# Patient Record
Sex: Male | Born: 1950 | Race: White | Hispanic: No | Marital: Married | Smoking: Never smoker
Health system: Southern US, Community
[De-identification: ages and names within clinical notes are randomized; demographics above are authoritative.]

## PROBLEM LIST (undated history)

## (undated) DIAGNOSIS — G20A1 Parkinson's disease without dyskinesia, without mention of fluctuations: Secondary | ICD-10-CM

## (undated) DIAGNOSIS — K219 Gastro-esophageal reflux disease without esophagitis: Secondary | ICD-10-CM

## (undated) DIAGNOSIS — R05 Cough: Secondary | ICD-10-CM

## (undated) DIAGNOSIS — N200 Calculus of kidney: Secondary | ICD-10-CM

## (undated) DIAGNOSIS — R059 Cough, unspecified: Secondary | ICD-10-CM

## (undated) DIAGNOSIS — K2270 Barrett's esophagus without dysplasia: Secondary | ICD-10-CM

## (undated) HISTORY — DX: Cough, unspecified: R05.9

## (undated) HISTORY — DX: Cough: R05

## (undated) HISTORY — DX: Barrett's esophagus without dysplasia: K22.70

## (undated) HISTORY — DX: Gastro-esophageal reflux disease without esophagitis: K21.9

## (undated) HISTORY — PX: NO PAST SURGERIES: SHX2092

---

## 1998-11-30 DIAGNOSIS — N486 Induration penis plastica: Secondary | ICD-10-CM | POA: Insufficient documentation

## 2004-10-21 ENCOUNTER — Ambulatory Visit: Payer: Self-pay | Admitting: Urology

## 2004-10-21 ENCOUNTER — Emergency Department: Payer: Self-pay | Admitting: Internal Medicine

## 2004-10-23 ENCOUNTER — Emergency Department: Payer: Self-pay | Admitting: Emergency Medicine

## 2004-10-25 ENCOUNTER — Inpatient Hospital Stay: Payer: Self-pay | Admitting: Urology

## 2005-01-21 ENCOUNTER — Ambulatory Visit: Payer: Self-pay | Admitting: Unknown Physician Specialty

## 2005-11-15 ENCOUNTER — Emergency Department: Payer: Self-pay | Admitting: Emergency Medicine

## 2006-12-14 ENCOUNTER — Ambulatory Visit: Payer: Self-pay | Admitting: Pulmonary Disease

## 2008-09-01 ENCOUNTER — Emergency Department: Payer: Self-pay | Admitting: Emergency Medicine

## 2009-03-01 DIAGNOSIS — E559 Vitamin D deficiency, unspecified: Secondary | ICD-10-CM | POA: Insufficient documentation

## 2011-02-05 ENCOUNTER — Ambulatory Visit (INDEPENDENT_AMBULATORY_CARE_PROVIDER_SITE_OTHER)
Admission: RE | Admit: 2011-02-05 | Discharge: 2011-02-05 | Disposition: A | Discharge status: Home / Self Care | Payer: BC Managed Care – PPO | Source: Ambulatory Visit | Attending: Emergency Medicine | Admitting: Emergency Medicine

## 2011-02-05 ENCOUNTER — Other Ambulatory Visit: Payer: Self-pay | Admitting: Emergency Medicine

## 2011-02-05 ENCOUNTER — Institutional Professional Consult (permissible substitution) (INDEPENDENT_AMBULATORY_CARE_PROVIDER_SITE_OTHER): Payer: BC Managed Care – PPO | Admitting: Emergency Medicine

## 2011-02-05 ENCOUNTER — Encounter: Payer: Self-pay | Admitting: Emergency Medicine

## 2011-02-05 DIAGNOSIS — R059 Cough, unspecified: Secondary | ICD-10-CM

## 2011-02-05 DIAGNOSIS — K219 Gastro-esophageal reflux disease without esophagitis: Secondary | ICD-10-CM | POA: Insufficient documentation

## 2011-02-05 DIAGNOSIS — R05 Cough: Secondary | ICD-10-CM

## 2011-02-05 DIAGNOSIS — K2270 Barrett's esophagus without dysplasia: Secondary | ICD-10-CM | POA: Insufficient documentation

## 2011-02-10 NOTE — Assessment & Plan Note (Signed)
Summary: cough   Visit Type:  Initial Consult Primary Provider/Referring Provider:  Dr. Julieanne Manson  CC:  Self referral...assess cough.  Pt says his cough is worse after a meal...sputum is white/tan...sometimes stringy or looks like coffee grounds.  History of Present Illness: 60 yo man, never smoker, hx Barrett's esophagus and GERD. Most recent EGD showed Barretts was stable. Tells me me that he has been cough-free until about 2 months ago after a URI. The cold went away but the cough persisted. Some throat clearing. Globus sensation with a lot of talking. Usually non-productive, but occasionally brings up some white stringy plugs, particles - ? whether this is mucous, ? regurgitated particles. Has had a swallowing study before, no aspiration. Currently on prilosec once daily.   Preventive Screening-Counseling & Management  Alcohol-Tobacco     Alcohol drinks/day: <1     Alcohol type: beer     Smoking Status: never  Current Medications (verified): 1)  Prilosec Otc 20 Mg Tbec (Omeprazole Magnesium) .Marland Kitchen.. 1 By Mouth Daily 2)  Aspirin 81 Mg Tabs (Aspirin) .Marland Kitchen.. 1 By Mouth Daily  Allergies (verified): 1)  ! Toradol 2)  ! * Cefzil 3)  ! Morphine  Past History:  Family History: Last updated: 02/05/2011 Family History Lung Cancer---mother  Risk Factors: Alcohol Use: <1 (02/05/2011)  Past Medical History: Current Problems:  COUGH (ICD-786.2) BARRETTS ESOPHAGUS (ICD-530.85) G E R D (ICD-530.81)  Past Surgical History: none  Family History: Reviewed history and no changes required. Family History Lung Cancer---mother  Social History: Reviewed history and no changes required. Patient never smoked.  Works in [employer], wool, Estate agent, etc... marriedSmoking Status:  never Alcohol drinks/day:  <1  Review of Systems       The patient complains of productive cough and acid heartburn.  The patient denies shortness of breath with activity, shortness of breath at rest,  non-productive cough, coughing up blood, chest pain, irregular heartbeats, indigestion, loss of appetite, weight change, abdominal pain, difficulty swallowing, sore throat, tooth/dental problems, headaches, nasal congestion/difficulty breathing through nose, sneezing, itching, ear ache, anxiety, depression, hand/feet swelling, joint stiffness or pain, rash, change in color of mucus, and fever.    Vital Signs:  Patient profile:   60 year old male Height:      74 inches (187.96 cm) Weight:      217.50 pounds (98.86 kg) BMI:     28.03 O2 Sat:      97 % on Room air Temp:     98.8 degrees F (37.11 degrees C) oral Pulse rate:   81 / minute BP sitting:   104 / 70  (left arm) Cuff size:   regular  Vitals Entered By: Michel Bickers CMA (February 05, 2011 4:21 PM)  O2 Sat at Rest %:  97 O2 Flow:  Room air CC: Self referral...assess cough.  Pt says his cough is worse after a meal...sputum is white/tan...sometimes stringy or looks like coffee grounds Is Patient Diabetic? No Comments Medications reviewed with patient Michel Bickers CMA  February 05, 2011 4:21 PM   Physical Exam  General:  well developed, well nourished, in no acute distress Head:  normocephalic and atraumatic Eyes:  conjunctiva and sclera clear Nose:  no deformity, discharge, inflammation, or lesions Mouth:  no deformity or lesions Neck:  no masses, thyromegaly, or abnormal cervical nodes Chest Wall:  no deformities noted Lungs:  clear bilaterally to auscultation and percussion Heart:  regular rate and rhythm, S1, S2 without murmurs, rubs, gallops, or clicks Msk:  no deformity or scoliosis noted with normal posture Extremities:  no clubbing, cyanosis, edema, or deformity noted Skin:  intact without lesions or rashes Psych:  alert and cooperative; normal mood and affect; normal attention span and concentration   Impression & Recommendations:  Problem # 1:  COUGH (ICD-786.2) Likely started as URI but being sustained and driven by  GERD. - empirically increase GERD therapy - If continues to cough after a month then consider further imaging to r/o bronchiectasis, spiro, possibly FOB  Medications Added to Medication List This Visit: 1)  Prilosec Otc 20 Mg Tbec (Omeprazole magnesium) .Marland Kitchen.. 1 by mouth daily 2)  Aspirin 81 Mg Tabs (Aspirin) .Marland Kitchen.. 1 by mouth daily  Other Orders: T-2 View CXR (71020TC) Consultation Level IV (16109)  Patient Instructions: 1)  CXR today  2)  Increase your prilosec to two times a day for the next 3 weeks, then go back to once daily  3)  Avoid throat clearing 4)  Rest your voice this weekend 5)  Follow up with Dr Delton Coombes in 1 month or as needed

## 2011-02-17 NOTE — Progress Notes (Signed)
Summary: Consult form/[facility]  Consult form/[facility]   Imported By: Sherian Rein 02/11/2011 14:48:18  _____________________________________________________________________  External Attachment:    Type:   Image     Comment:   External Document

## 2011-03-05 ENCOUNTER — Encounter: Payer: Self-pay | Admitting: Emergency Medicine

## 2011-03-10 ENCOUNTER — Ambulatory Visit: Payer: BC Managed Care – PPO | Admitting: Emergency Medicine

## 2015-05-24 ENCOUNTER — Ambulatory Visit (INDEPENDENT_AMBULATORY_CARE_PROVIDER_SITE_OTHER): Payer: Medicare Other | Admitting: Family Medicine

## 2015-05-24 ENCOUNTER — Encounter: Payer: Self-pay | Admitting: Family Medicine

## 2015-05-24 VITALS — BP 120/82 | HR 76 | Temp 98.5°F | Resp 16 | Ht 74.0 in | Wt 218.0 lb

## 2015-05-24 DIAGNOSIS — T148 Other injury of unspecified body region: Secondary | ICD-10-CM

## 2015-05-24 DIAGNOSIS — W57XXXA Bitten or stung by nonvenomous insect and other nonvenomous arthropods, initial encounter: Secondary | ICD-10-CM

## 2015-05-24 MED ORDER — MINOCYCLINE HCL 100 MG PO CAPS: 100.0000 mg | ORAL_CAPSULE | Freq: Two times a day (BID) | ORAL | Status: DC

## 2015-05-24 NOTE — Patient Instructions (Signed)
Use hydrocortisone cream for itching 2-3 x day.

## 2015-05-24 NOTE — Progress Notes (Signed)
Subjective:     Patient ID: Robert Fuller, male   DOB: Dec 11, 1950, 64 y.o.   MRN: [id number]  HPI  Chief Complaint  Patient presents with  . Insect Bite    Tick on chest 2-3 days ago. Pt notes there is a bullseye around the bite. Pt denies fever, N/V. Pt states he had a slight headache.  Reports he got the tick off at 24 hours. Headache has resolved.   Review of Systems  Constitutional: Negative for fever and chills.  Musculoskeletal:       No body aches       Objective:   Physical Exam  Constitutional: He appears well-developed and well-nourished. No distress.  Skin:  Insect bite without residual  Foreign body and surrounding approximately 6 cm.area of erythema       Assessment:    1. Tick bite - minocycline (MINOCIN,DYNACIN) 100 MG capsule; Take 1 capsule (100 mg total) by mouth 2 (two) times daily.  Dispense: 20 capsule; Refill: 1    Plan:   If redness does not resolve with hydrocortisone or increases in size to start abx.

## 2015-07-02 DIAGNOSIS — N200 Calculus of kidney: Secondary | ICD-10-CM | POA: Diagnosis not present

## 2015-09-24 DIAGNOSIS — M79671 Pain in right foot: Secondary | ICD-10-CM | POA: Diagnosis not present

## 2015-09-24 DIAGNOSIS — M2041 Other hammer toe(s) (acquired), right foot: Secondary | ICD-10-CM | POA: Diagnosis not present

## 2015-09-24 DIAGNOSIS — M659 Synovitis and tenosynovitis, unspecified: Secondary | ICD-10-CM | POA: Diagnosis not present

## 2015-12-03 DIAGNOSIS — L309 Dermatitis, unspecified: Secondary | ICD-10-CM | POA: Diagnosis not present

## 2015-12-03 DIAGNOSIS — L02222 Furuncle of back [any part, except buttock]: Secondary | ICD-10-CM | POA: Diagnosis not present

## 2016-02-17 DIAGNOSIS — H903 Sensorineural hearing loss, bilateral: Secondary | ICD-10-CM | POA: Diagnosis not present

## 2016-02-17 DIAGNOSIS — H9313 Tinnitus, bilateral: Secondary | ICD-10-CM | POA: Diagnosis not present

## 2016-02-21 DIAGNOSIS — K219 Gastro-esophageal reflux disease without esophagitis: Secondary | ICD-10-CM | POA: Diagnosis not present

## 2016-02-21 DIAGNOSIS — K2270 Barrett's esophagus without dysplasia: Secondary | ICD-10-CM | POA: Diagnosis not present

## 2016-04-03 ENCOUNTER — Encounter: Payer: Self-pay | Admitting: *Deleted

## 2016-04-06 ENCOUNTER — Ambulatory Visit: Payer: Medicare Other | Admitting: Anesthesiology

## 2016-04-06 ENCOUNTER — Encounter: Payer: Self-pay | Admitting: *Deleted

## 2016-04-06 ENCOUNTER — Encounter
Admission: RE | Disposition: A | Discharge status: Home / Self Care | Payer: Self-pay | Source: Ambulatory Visit | Attending: Gastroenterology

## 2016-04-06 ENCOUNTER — Ambulatory Visit
Admission: RE | Admit: 2016-04-06 | Discharge: 2016-04-06 | Disposition: A | Discharge status: Home / Self Care | Payer: Medicare Other | Source: Ambulatory Visit | Attending: Gastroenterology | Admitting: Gastroenterology

## 2016-04-06 DIAGNOSIS — K219 Gastro-esophageal reflux disease without esophagitis: Secondary | ICD-10-CM | POA: Diagnosis not present

## 2016-04-06 DIAGNOSIS — Z79899 Other long term (current) drug therapy: Secondary | ICD-10-CM | POA: Diagnosis not present

## 2016-04-06 DIAGNOSIS — K2270 Barrett's esophagus without dysplasia: Secondary | ICD-10-CM | POA: Insufficient documentation

## 2016-04-06 DIAGNOSIS — Z87442 Personal history of urinary calculi: Secondary | ICD-10-CM | POA: Insufficient documentation

## 2016-04-06 DIAGNOSIS — Z7982 Long term (current) use of aspirin: Secondary | ICD-10-CM | POA: Insufficient documentation

## 2016-04-06 HISTORY — PX: ESOPHAGOGASTRODUODENOSCOPY (EGD) WITH PROPOFOL: SHX5813

## 2016-04-06 HISTORY — PX: UPPER GI ENDOSCOPY: SHX6162

## 2016-04-06 HISTORY — DX: Calculus of kidney: N20.0

## 2016-04-06 LAB — SURGICAL PATHOLOGY

## 2016-04-06 SURGERY — ESOPHAGOGASTRODUODENOSCOPY (EGD) WITH PROPOFOL
Anesthesia: General

## 2016-04-06 MED ORDER — PROPOFOL 500 MG/50ML IV EMUL
INTRAVENOUS | Status: DC | PRN
  Administered 2016-04-06: 160 ug/kg/min via INTRAVENOUS

## 2016-04-06 MED ORDER — SODIUM CHLORIDE 0.9 % IV SOLN
INTRAVENOUS | Status: DC
  Administered 2016-04-06: 09:00:00 via INTRAVENOUS

## 2016-04-06 MED ORDER — PROPOFOL 10 MG/ML IV BOLUS
INTRAVENOUS | Status: DC | PRN
  Administered 2016-04-06: 40 mg via INTRAVENOUS
  Administered 2016-04-06: 60 mg via INTRAVENOUS

## 2016-04-06 MED ORDER — GLYCOPYRROLATE 0.2 MG/ML IJ SOLN
INTRAMUSCULAR | Status: DC | PRN
  Administered 2016-04-06: 0.2 mg via INTRAVENOUS

## 2016-04-06 MED ORDER — ATROPINE SULFATE 1 MG/10ML IJ SOSY
PREFILLED_SYRINGE | INTRAMUSCULAR | Status: AC
  Filled 2016-04-06: qty 10

## 2016-04-06 MED ORDER — LIDOCAINE HCL (CARDIAC) 20 MG/ML IV SOLN
INTRAVENOUS | Status: DC | PRN
  Administered 2016-04-06: 100 mg via INTRAVENOUS

## 2016-04-06 NOTE — H&P (Signed)
    Primary Care Physician:  [name], MD Primary Gastroenterologist:  Dr. Candace Cruise  Pre-Procedure History & Physical: HPI:  Robert Fuller is a 65 y.o. male is here for an EGD.   Past Medical History  Diagnosis Date  . Cough   . Barrett's esophagus   . Esophageal reflux   . Kidney stone     Past Surgical History  Procedure Laterality Date  . No past surgeries      Prior to Admission medications   Medication Sig Start Date End Date Taking? Authorizing Provider  aspirin 81 MG tablet Take 81 mg by mouth daily.     Yes Historical Provider, MD  minocycline (MINOCIN,DYNACIN) 100 MG capsule Take 1 capsule (100 mg total) by mouth 2 (two) times daily. 05/24/15   Carmon Ginsberg, PA  omeprazole (PRILOSEC) 20 MG capsule Take 20 mg by mouth daily.      Historical Provider, MD    Allergies as of 03/25/2016 - Review Complete 05/24/2015  Allergen Reaction Noted  . Cefprozil    . Ketorolac tromethamine    . Morphine      Family History  Problem Relation Age of Onset  . Lung cancer Mother   . Parkinsonism Father     Social History   Social History  . Marital Status: Married    Spouse Name: N/A  . Number of Children: N/A  . Years of Education: N/A   Occupational History  . textiles    Social History Main Topics  . Smoking status: Never Smoker   . Smokeless tobacco: Never Used  . Alcohol Use: Yes  . Drug Use: No  . Sexual Activity: Not on file   Other Topics Concern  . Not on file   Social History Narrative    Review of Systems: See HPI, otherwise negative ROS  Physical Exam: BP 137/94 mmHg  Pulse 52  Temp(Src) 97 F (36.1 C) (Tympanic)  Resp 18  Ht 6\' 2"  (1.88 m)  Wt 218 lb (98.884 kg)  BMI 27.98 kg/m2  SpO2 97% General:   Alert,  pleasant and cooperative in NAD Head:  Normocephalic and atraumatic. Neck:  Supple; no masses or thyromegaly. Lungs:  Clear throughout to auscultation.    Heart:  Regular rate and rhythm. Abdomen:  Soft, nontender and  nondistended. Normal bowel sounds, without guarding, and without rebound.   Neurologic:  Alert and  oriented x4;  grossly normal neurologically.  Impression/Plan: Robert Fuller is here for an EGD to be performed for Hx of Barrett's  Risks, benefits, limitations, and alternatives regarding  EGD have been reviewed with the patient.  Questions have been answered.  All parties agreeable.   Marissah Vandemark, Lupita Dawn, MD  04/06/2016, 8:29 AM

## 2016-04-06 NOTE — Anesthesia Postprocedure Evaluation (Signed)
Anesthesia Post Note  Patient: LONZA METHOT  Procedure(s) Performed: Procedure(s) (LRB): ESOPHAGOGASTRODUODENOSCOPY (EGD) WITH PROPOFOL (N/A)  Patient location during evaluation: Endoscopy Anesthesia Type: General Level of consciousness: awake and alert Pain management: pain level controlled Vital Signs Assessment: post-procedure vital signs reviewed and stable Respiratory status: spontaneous breathing, nonlabored ventilation, respiratory function stable and patient connected to nasal cannula oxygen Cardiovascular status: blood pressure returned to baseline and stable Postop Assessment: no signs of nausea or vomiting Anesthetic complications: no    Last Vitals:  Filed Vitals:   04/06/16 0944 04/06/16 0954  BP: 134/97 127/97  Pulse: 69 62  Temp:    Resp: 18 12    Last Pain:  Filed Vitals:   04/06/16 0958  PainSc: Asleep                 Precious Haws Mandy Fitzwater

## 2016-04-06 NOTE — Op Note (Signed)
[facility] Gastroenterology Patient Name: Robert Fuller Procedure Date: 04/06/2016 9:09 AM MRN: [id number] Account #: [id number] Date of Birth: [birth year] Admit Type: Outpatient Age: 65 Room: [facility] ENDO ROOM 4 Gender: Male Note Status: Finalized Procedure:            Upper GI endoscopy Indications:          Follow-up of Barrett's esophagus Providers:            Lupita Dawn. Candace Cruise, MD Referring MD:         Laney Pastor (Referring MD) Medicines:            Monitored Anesthesia Care Complications:        No immediate complications. Procedure:            Pre-Anesthesia Assessment:                       - Prior to the procedure, a History and Physical was                        performed, and patient medications, allergies and                        sensitivities were reviewed. The patient's tolerance of                        previous anesthesia was reviewed.                       - The risks and benefits of the procedure and the                        sedation options and risks were discussed with the                        patient. All questions were answered and informed                        consent was obtained.                       - After reviewing the risks and benefits, the patient                        was deemed in satisfactory condition to undergo the                        procedure.                       After obtaining informed consent, the endoscope was                        passed under direct vision. Throughout the procedure,                        the patient's blood pressure, pulse, and oxygen                        saturations were monitored continuously. The Endoscope  was introduced through the mouth, and advanced to the                        second part of duodenum. The upper GI endoscopy was                        accomplished without difficulty. The patient tolerated                        the procedure well. Findings:     The esophagus and gastroesophageal junction were examined with white       light and narrow band imaging (NBI). There were esophageal mucosal       changes consistent with short-segment Barrett's esophagus. These changes       involved the mucosa at the upper extent of the gastric folds (40 cm from       the incisors) extending to the Z-line (37 cm from the incisors).       Circumferential salmon-colored mucosa was present from 37 to 39 cm, and       scattered islands of salmon-colored mucosa were present from 39 to 40       cm. The maximum longitudinal extent of these esophageal mucosal changes       was 3 cm in length. Mucosa was biopsied with a cold forceps for       histology in 4 quadrants in the lower third of the esophagus and at 37       and 40 cm from the incisors. A total of 2 specimen bottles were sent to       pathology.      The exam was otherwise without abnormality.      The entire examined stomach was normal.      The examined duodenum was normal. Impression:           - Esophageal mucosal changes consistent with                        short-segment Barrett's esophagus. Biopsied.                       - The examination was otherwise normal.                       - Normal stomach.                       - Normal examined duodenum. Recommendation:       - Discharge patient to home.                       - Observe patient's clinical course.                       - The findings and recommendations were discussed with                        the patient. Procedure Code(s):    --- Professional ---                       [phone], Esophagogastroduodenoscopy, flexible, transoral;  with biopsy, single or multiple Diagnosis Code(s):    --- Professional ---                       K22.70, Barrett's esophagus without dysplasia CPT copyright 2016 American Medical Association. All rights reserved. The codes documented in this report are preliminary and upon coder  review may  be revised to meet current compliance requirements. Hulen Luster, MD 04/06/2016 9:29:02 AM This report has been signed electronically. Number of Addenda: 0 Note Initiated On: 04/06/2016 9:09 AM      [facility]

## 2016-04-06 NOTE — Anesthesia Preprocedure Evaluation (Signed)
Anesthesia Evaluation  Patient identified by MRN, date of birth, ID band Patient awake    Reviewed: Allergy & Precautions, H&P , NPO status , Patient's Chart, lab work & pertinent test results  History of Anesthesia Complications Negative for: history of anesthetic complications  Airway Mallampati: III  TM Distance: >3 FB Neck ROM: full    Dental  (+) Poor Dentition   Pulmonary neg pulmonary ROS, neg shortness of breath,    Pulmonary exam normal breath sounds clear to auscultation       Cardiovascular Exercise Tolerance: Good (-) angina(-) Past MI and (-) DOE negative cardio ROS Normal cardiovascular exam Rhythm:regular Rate:Normal     Neuro/Psych negative neurological ROS  negative psych ROS   GI/Hepatic Neg liver ROS, GERD  Controlled,  Endo/Other  negative endocrine ROS  Renal/GU Renal disease  negative genitourinary   Musculoskeletal   Abdominal   Peds  Hematology negative hematology ROS (+)   Anesthesia Other Findings Past Medical History:   Cough                                                        Barrett's esophagus                                          Esophageal reflux                                            Kidney stone                                                Past Surgical History:   NO PAST SURGERIES                                            BMI    Body Mass Index   27.97 kg/m 2    Signs and symptoms suggestive of sleep apnea    Reproductive/Obstetrics negative OB ROS                             Anesthesia Physical Anesthesia Plan  ASA: III  Anesthesia Plan: General   Post-op Pain Management:    Induction:   Airway Management Planned:   Additional Equipment:   Intra-op Plan:   Post-operative Plan:   Informed Consent: I have reviewed the patients History and Physical, chart, labs and discussed the procedure including the risks, benefits  and alternatives for the proposed anesthesia with the patient or authorized representative who has indicated his/her understanding and acceptance.   Dental Advisory Given  Plan Discussed with: Anesthesiologist, CRNA and Surgeon  Anesthesia Plan Comments:         Anesthesia Quick Evaluation

## 2016-04-06 NOTE — Transfer of Care (Signed)
Immediate Anesthesia Transfer of Care Note  Patient: Robert Fuller  Procedure(s) Performed: Procedure(s): ESOPHAGOGASTRODUODENOSCOPY (EGD) WITH PROPOFOL (N/A)  Patient Location: Endoscopy Unit  Anesthesia Type:General  Level of Consciousness: sedated  Airway & Oxygen Therapy: Patient connected to nasal cannula oxygen  Post-op Assessment: Post -op Vital signs reviewed and stable  Post vital signs: stable  Last Vitals:  Filed Vitals:   04/06/16 0924 04/06/16 0925  BP: 107/70 107/70  Pulse: 65 67  Temp: 36.2 C 36.2 C  Resp: 11 10    Last Pain:  Filed Vitals:   04/06/16 0925  PainSc: Asleep         Complications: No apparent anesthesia complications

## 2016-04-07 ENCOUNTER — Encounter: Payer: Self-pay | Admitting: Gastroenterology

## 2016-04-30 ENCOUNTER — Emergency Department
Admission: EM | Admit: 2016-04-30 | Discharge: 2016-04-30 | Disposition: A | Discharge status: Home / Self Care | Payer: Medicare Other | Attending: Emergency Medicine | Admitting: Emergency Medicine

## 2016-04-30 ENCOUNTER — Encounter: Payer: Self-pay | Admitting: *Deleted

## 2016-04-30 DIAGNOSIS — Z711 Person with feared health complaint in whom no diagnosis is made: Secondary | ICD-10-CM | POA: Insufficient documentation

## 2016-04-30 DIAGNOSIS — Z7982 Long term (current) use of aspirin: Secondary | ICD-10-CM | POA: Diagnosis not present

## 2016-04-30 DIAGNOSIS — Z79899 Other long term (current) drug therapy: Secondary | ICD-10-CM | POA: Diagnosis not present

## 2016-04-30 DIAGNOSIS — Z0189 Encounter for other specified special examinations: Secondary | ICD-10-CM | POA: Diagnosis present

## 2016-04-30 NOTE — ED Notes (Signed)
Pt put foot in boot and stepped on a dead bat

## 2016-04-30 NOTE — ED Notes (Signed)
Pt to ed with c/o finding dead bat in shoe this am.  Pt reports he stuck foot into the shoe, felt something and then reached into shoe and removed dead bat with his hand.  Pt states immediately used hydrogen peroxide on hand and on foot, even though foot was originally covered in sock.

## 2016-04-30 NOTE — ED Provider Notes (Signed)
[facility] Emergency Department Provider Note  ____________________________________________  Time seen: Approximately 12:37 PM  I have reviewed the triage vital signs and the nursing notes.   HISTORY  Chief Complaint bat exposure     HPI Robert Fuller is a 65 y.o. male, NAD, who presents to the emergency department after being exposed to a dead bat a few hours ago. He had just returned from a 10-day family vacation and went to put on a pair of boots that he stores in his garage. He felt what he thought was a sock in the toe of the boot, but discovered a dead bat when he overturned the boot in his hand. He was wearing a pair of heavy-duty work socks when his foot came in contact with the dead bat and washed his hands and foot thoroughly with soap, water and hydrogen peroxide. He called his primary care provider (Dr. Rosanna Randy) and was told to report to the ED. He brought the dead bat along with him in a paper towel inside of a ziploc bag. He has no open wounds and no puncture wounds or broken skin resulted from contact with the dead bat. He denies pain, fever, chills, skin changes, nausea, vomiting, or fatigue. He reports that wants the dead bat tested for rabies.   Past Medical History  Diagnosis Date  . Cough   . Barrett's esophagus   . Esophageal reflux   . Kidney stone     Patient Active Problem List   Diagnosis Date Noted  . G E R D 02/05/2011  . BARRETTS ESOPHAGUS 02/05/2011  . COUGH 02/05/2011    Past Surgical History  Procedure Laterality Date  . No past surgeries    . Esophagogastroduodenoscopy (egd) with propofol N/A 04/06/2016    Procedure: ESOPHAGOGASTRODUODENOSCOPY (EGD) WITH PROPOFOL;  Surgeon: Hulen Luster, MD;  Location: [facility] ENDOSCOPY;  Service: Gastroenterology;  Laterality: N/A;  . Upper gi endoscopy  04/06/16    normal other than esophageal changes present consistent with barretts esophagus, repeat 2019    Current Outpatient Rx  Name   Route  Sig  Dispense  Refill  . aspirin 81 MG tablet   Oral   Take 81 mg by mouth daily.           . minocycline (MINOCIN,DYNACIN) 100 MG capsule   Oral   Take 1 capsule (100 mg total) by mouth 2 (two) times daily.   20 capsule   1   . omeprazole (PRILOSEC) 20 MG capsule   Oral   Take 20 mg by mouth daily.             Allergies Cefprozil; Ketorolac tromethamine; and Morphine  Family History  Problem Relation Age of Onset  . Lung cancer Mother   . Parkinsonism Father     Social History Social History  Substance Use Topics  . Smoking status: Never Smoker   . Smokeless tobacco: Never Used  . Alcohol Use: Yes     Review of Systems  Constitutional: No fever/chills Cardiovascular: No chest pain. Respiratory: No shortness of breath. No wheezing.  Gastrointestinal: No abdominal pain.  No nausea, vomiting.  Musculoskeletal: Negative for foot or hand pain.  Skin: Negative for rash, open wounds, bleeding, lesions, swelling. Neurological: Negative for headaches, focal weakness or numbness. No tingling 10-point ROS otherwise negative.  ____________________________________________   PHYSICAL EXAM:  VITAL SIGNS: ED Triage Vitals  Enc Vitals Group     BP 04/30/16 1151 146/81 mmHg  Pulse Rate 04/30/16 1151 74     Resp 04/30/16 1151 20     Temp 04/30/16 1151 98.6 F (37 C)     Temp Source 04/30/16 1151 Oral     SpO2 04/30/16 1151 97 %     Weight 04/30/16 1151 220 lb (99.791 kg)     Height 04/30/16 1151 6\' 2"  (1.88 m)     Head Cir --      Peak Flow --      Pain Score --      Pain Loc --      Pain Edu? --      Excl. in [place]? --      Constitutional: Alert and oriented. Well appearing and in no acute distress. Eyes: Conjunctivae are normal.  Head: Atraumatic. Respiratory: Normal respiratory effort without tachypnea or retractions.  Neurologic:  Normal speech and language. No gross focal neurologic deficits are appreciated. Gait and posture are normal Skin:   Skin is warm, dry and intact. No rash noted. Psychiatric: Mood and affect are normal. Speech and behavior are normal. Patient exhibits appropriate insight and judgement.   ____________________________________________   LABS  None ____________________________________________  EKG  None ____________________________________________  RADIOLOGY  None ____________________________________________    PROCEDURES  Procedure(s) performed: None   Medications - No data to display   ____________________________________________   INITIAL IMPRESSION / ASSESSMENT AND PLAN / ED COURSE  Patient's diagnosis is consistent with feared complaint without diagnosis. Patient was reassured that he was unlikely exposed to rabies as the transmittal is through saliva and open skin. Patient was given information in regards to rabies for educational purposes. He was also given the phone number for [place] control so that he can call and gain further information. Patient is given ED precautions to return to the ED for any worsening or new symptoms.    ____________________________________________  FINAL CLINICAL IMPRESSION(S) / ED DIAGNOSES  Final diagnoses:  Feared complaint without diagnosis      NEW MEDICATIONS STARTED DURING THIS VISIT:  Discharge Medication List as of 04/30/2016 12:39 PM           Braxton Feathers, PA-C 04/30/16 1311  Lavonia Drafts, MD 04/30/16 1526

## 2016-04-30 NOTE — Discharge Instructions (Signed)
Rabies °Rabies is a viral infection that can be spread to people from infected animals. The infection affects the brain and central nervous system. Once the disease develops, it almost always causes death. Because of this, when a person is bitten by an animal that may have rabies, treatment to prevent rabies often needs to be started whether or not the animal is known to be infected. Prompt treatment with the rabies vaccine and rabies immune globulin is very effective at preventing the infection from developing in people who have been exposed to the rabies virus. °CAUSES  °Rabies is caused by a virus that lives inside some animals. When a person is bitten by an infected animal, the rabies virus is spread to the person through the infected spit (saliva) of the animal. This virus can be carried by animals such as dogs, cats, skunks, bats, woodchucks, raccoons, coyotes, and foxes. °SYMPTOMS  °By the time symptoms appear, rabies is usually fatal for the person. Common symptoms include: °· Headache. °· Fever. °· Fatigue and weakness. °· Agitation. °· Anxiety. °· Confusion. °· Unusual behavior, such as hyperactivity, fear of water (hydrophobia), or fear of air (aerophobia). °· Hallucinations. °· Insomnia. °· Weakness in the arms or legs. °· Difficulty swallowing. °Most people get sick in 1-3 months after being bitten. This often varies and may depend on the location of the bite. The infection will take less time to develop if the bite occurred closer to the head.  °DIAGNOSIS  °To determine if a person is infected, several tests must be performed, such as: °· A skin biopsy. °· A saliva test. °· A lumbar puncture to remove spinal fluid so it can be examined. °· Blood tests. °TREATMENT  °Treatment to prevent the infection from developing (post-exposure prophylaxis, PEP) is often started before knowing for sure if the person has been exposed to the rabies virus. PEP involves cleaning the wound, giving an antibody injection  (rabies immune globulin), and giving a series of rabies vaccine injections. The series of injections are usually given over a two-week period. If possible, the animal that bit the person will be observed to see if it remains healthy. If the animal has been killed, it can be sent to a state laboratory and examined to see if the animal had rabies. °If a person is bitten by a domestic animal (dog, cat, or ferret) that appears healthy and can be observed to see if it remains healthy, often no further treatment is necessary other than care of the wounds caused by the animal. °Rabies is often a fatal illness once the infection develops in a person. Although a few people who developed rabies have survived after experimental treatment with certain drugs, all these survivors still had severe nervous system problems after the treatment. This is why caregivers use extra caution and begin PEP treatment for people who have been bitten by animals that are possibly infected with rabies.  °HOME CARE INSTRUCTIONS  °If you were bitten by an unknown animal, make sure you know your caregiver's instructions for follow-up. If the animal was sent to a laboratory for examination, ask when the test results will be ready. Make sure you get the test results.  °Take these steps to care for your wound: °· Keep the wound clean, dry, and dressed as directed by your caregiver. °· Keep the injured part elevated as much as possible. °· Do not resume use of the affected area until directed. °· Only take over-the-counter or prescription medicines as directed by your   caregiver. °· Keep all follow-up appointments as directed by your caregiver. °PREVENTION  °To prevent rabies, people need to reduce their risk of having contact with infected animals.  °· Make sure your pets (dogs, cats, ferrets) are vaccinated against rabies. Keep these vaccinations up-to-date as directed by your veterinarian. °· Supervise your pets when they are outside. Keep them away  from wild animals. °· Call your local animal control services to report any stray animals. These animals may not be vaccinated. °· Stay away from stray or wild animals. °· Consider getting the rabies vaccine (preexposure) if you are traveling to an area where rabies is common or if your job or activities involve possible contact with wild or stray animals. Discuss this with your caregiver. °  °This information is not intended to replace advice given to you by your health care provider. Make sure you discuss any questions you have with your health care provider. °  °Document Released: 11/16/2005 Document Revised: 12/07/2014 Document Reviewed: 06/14/2012 °Elsevier Interactive Patient Education ©2016 Elsevier Inc. ° °

## 2016-04-30 NOTE — ED Notes (Signed)
Pt given d/c instructions but unable to have pt sign due to computer issues.

## 2016-05-04 ENCOUNTER — Telehealth: Payer: Self-pay | Admitting: Family Medicine

## 2016-05-04 NOTE — Telephone Encounter (Signed)
Patient has been advised the Dr. Rosanna Randy recommended he go with the health department's recommendation.  ED

## 2016-05-04 NOTE — Telephone Encounter (Signed)
Pt would like a call back from Dr. Rosanna Randy. Pt stated that he had found a dead bat in his shoe after he had already put his foot into the shoe and our office advised him to go to the ER. Pt stated he went to the ER and they sent the bat off for testing. Pt stated the test came back inconclusive for rabies and was advised by the hospital the bat was dead dead so the rabies would have been dead as well and no need to worry with the shots. Pt said he received a call from the Health Dept and they advised pt since it was inconclusive he should go ahead and get the series of rabies shots. Pt would like to confirm with Dr. Rosanna Randy what he thinks pt's should do. Pt stated that since this has to do with a series of several shots he would like to speak directly with Dr. Rosanna Randy. Thanks TNP

## 2016-05-05 ENCOUNTER — Encounter: Payer: Self-pay | Admitting: Emergency Medicine

## 2016-05-05 ENCOUNTER — Emergency Department
Admission: EM | Admit: 2016-05-05 | Discharge: 2016-05-05 | Disposition: A | Discharge status: Home / Self Care | Payer: Medicare Other | Attending: Emergency Medicine | Admitting: Emergency Medicine

## 2016-05-05 DIAGNOSIS — Z79899 Other long term (current) drug therapy: Secondary | ICD-10-CM | POA: Diagnosis not present

## 2016-05-05 DIAGNOSIS — Z7982 Long term (current) use of aspirin: Secondary | ICD-10-CM | POA: Diagnosis not present

## 2016-05-05 DIAGNOSIS — Z23 Encounter for immunization: Secondary | ICD-10-CM

## 2016-05-05 DIAGNOSIS — Z203 Contact with and (suspected) exposure to rabies: Secondary | ICD-10-CM | POA: Insufficient documentation

## 2016-05-05 MED ORDER — RABIES VACCINE, PCEC IM SUSR
1.0000 mL | Freq: Once | INTRAMUSCULAR | Status: AC
  Administered 2016-05-05: 1 mL via INTRAMUSCULAR
  Filled 2016-05-05: qty 1

## 2016-05-05 MED ORDER — RABIES IMMUNE GLOBULIN 150 UNIT/ML IM INJ
20.0000 [IU]/kg | INJECTION | Freq: Once | INTRAMUSCULAR | Status: AC
  Administered 2016-05-05: 2025 [IU] via INTRAMUSCULAR
  Filled 2016-05-05: qty 14

## 2016-05-05 NOTE — ED Provider Notes (Signed)
[facility] Emergency Department Provider Note  ____________________________________________  Time seen: Approximately 2:38 PM  I have reviewed the triage vital signs and the nursing notes.   HISTORY  Chief Complaint Rabies Injection    HPI Robert Fuller is a 65 y.o. male , NAD, presents to the emergency department to begin rabies post exposure prophylactic immunization. Patient was seen in this emergency department approximately 5 days ago for exposure to a dead bat. The bat was sent for testing that was inconclusive for rabies. Since the testing was inconclusive the patient was sent to this emergency department to begin rabies immunization. Patient denies any fevers, chills, body aches, abdominal pain, nausea, vomiting, skin sores, open wounds. Has had no further exposures to other potentially rabid animals since his last visit.   Past Medical History  Diagnosis Date  . Cough   . Barrett's esophagus   . Esophageal reflux   . Kidney stone     Patient Active Problem List   Diagnosis Date Noted  . G E R D 02/05/2011  . BARRETTS ESOPHAGUS 02/05/2011  . COUGH 02/05/2011    Past Surgical History  Procedure Laterality Date  . No past surgeries    . Esophagogastroduodenoscopy (egd) with propofol N/A 04/06/2016    Procedure: ESOPHAGOGASTRODUODENOSCOPY (EGD) WITH PROPOFOL;  Surgeon: Hulen Luster, MD;  Location: [facility] ENDOSCOPY;  Service: Gastroenterology;  Laterality: N/A;  . Upper gi endoscopy  04/06/16    normal other than esophageal changes present consistent with barretts esophagus, repeat 2019    Current Outpatient Rx  Name  Route  Sig  Dispense  Refill  . aspirin 81 MG tablet   Oral   Take 81 mg by mouth daily.           . minocycline (MINOCIN,DYNACIN) 100 MG capsule   Oral   Take 1 capsule (100 mg total) by mouth 2 (two) times daily.   20 capsule   1   . omeprazole (PRILOSEC) 20 MG capsule   Oral   Take 20 mg by mouth daily.              Allergies Cefprozil; Ketorolac tromethamine; and Morphine  Family History  Problem Relation Age of Onset  . Lung cancer Mother   . Parkinsonism Father     Social History Social History  Substance Use Topics  . Smoking status: Never Smoker   . Smokeless tobacco: Never Used  . Alcohol Use: Yes     Review of Systems  Constitutional: No fever/chills, Fatigue Eyes: No visual changes.  Cardiovascular: No chest pain. Respiratory: No cough. No shortness of breath. No wheezing.  Gastrointestinal: No abdominal pain.  No nausea, vomiting.  No diarrhea.   Musculoskeletal: Negative for general myalgias.  Skin: Negative for rash, redness, swelling, open wounds, skin sores, oozing, weeping. Neurological: Negative for headaches, focal weakness or numbness. No tingling 10-point ROS otherwise negative.  ____________________________________________   PHYSICAL EXAM:  VITAL SIGNS: ED Triage Vitals  Enc Vitals Group     BP 05/05/16 1415 145/88 mmHg     Pulse Rate 05/05/16 1415 76     Resp 05/05/16 1415 18     Temp 05/05/16 1415 98.6 F (37 C)     Temp src --      SpO2 05/05/16 1415 95 %     Weight 05/05/16 1415 220 lb (99.791 kg)     Height 05/05/16 1415 6\' 2"  (1.88 m)     Head Cir --  Peak Flow --      Pain Score 05/05/16 1419 0     Pain Loc --      Pain Edu? --      Excl. in [place]? --      Constitutional: Alert and oriented. Well appearing and in no acute distress. Eyes: Conjunctivae are normal. Head: Atraumatic.  Neck: Supple with FROM.  Hematological/Lymphatic/Immunilogical: No cervical lymphadenopathy. Cardiovascular: Normal rate, regular rhythm. Normal S1 and S2. No murmurs, rubs, gallops. Good peripheral circulation. Respiratory: Normal respiratory effort without tachypnea or retractions. Lungs CTAB with breath sounds in all lung fields. Neurologic:  Normal speech and language. No gross focal neurologic deficits are appreciated. Gait and posture are normal   Skin:  Skin is warm, dry and intact. No rash noted. Psychiatric: Mood and affect are normal. Speech and behavior are normal. Patient exhibits appropriate insight and judgement.   ____________________________________________   LABS  None  ____________________________________________  EKG  None ____________________________________________  RADIOLOGY  None ____________________________________________    PROCEDURES  Procedure(s) performed: None   Medications  rabies vaccine (RABAVERT) injection 1 mL (1 mL Intramuscular Given 05/05/16 1617)  rabies immune globulin (HYPERAB) injection 2,025 Units (2,025 Units Intramuscular Given 05/05/16 1618)     ____________________________________________   INITIAL IMPRESSION / ASSESSMENT AND PLAN / ED COURSE  I spoke with Dr. Loyce Dys, epidemiologist with the [facility] in regards to this patient's case. She states that considering the lab work on the animal was inconclusive in his best area on the side of caution and proceed with prophylaxis for rabies immunization. She also instructs that considering the potential exposure was on the patient's right side of his body, that the immunoglobulin should be administered into the anterior lateral thigh and deltoid on the right side of the body. RabAvert immunization should be given in the left deltoid.  Patient's diagnosis is consistent with need for rabies vaccination. Patient will be discharged home with exit care on rabies and the rabies vaccination. Patient is to follow up with this emergency department in 3 days for continuation of immunization schedule. He will need RabAvert immunization on day 3, 7, 14 to finish the series. Patient notes that he will be out of town on a job when his stay 14 immunization is due. He will need a medical before that immunization to be able to get that while out of town. Patient is given ED precautions to return to the ED  for any worsening or new symptoms.    ____________________________________________  FINAL CLINICAL IMPRESSION(S) / ED DIAGNOSES  Final diagnoses:  Need for rabies vaccination      NEW MEDICATIONS STARTED DURING THIS VISIT:  New Prescriptions   No medications on file         Braxton Feathers, PA-C 05/05/16 1626  Lavonia Drafts, MD 05/05/16 1941

## 2016-05-05 NOTE — ED Notes (Signed)
Pt states over a week ago pt put on his boot after being out of town week and there was a dead bat in his boot. Pt went to ED initially on Wednesday. Today pt is here for rabies vaccine, pt states his family and PCP both recommend rabies vaccine

## 2016-05-05 NOTE — ED Notes (Signed)
Pt instructed on rabies vaccine information and subsequent injections. Informed patient he may go to [facility] for the rabies vaccine also.

## 2016-05-05 NOTE — Discharge Instructions (Signed)
Rabies °Rabies is a viral infection that can be spread to people from infected animals. The infection affects the brain and central nervous system. Once the disease develops, it almost always causes death. Because of this, when a person is bitten by an animal that may have rabies, treatment to prevent rabies often needs to be started whether or not the animal is known to be infected. Prompt treatment with the rabies vaccine and rabies immune globulin is very effective at preventing the infection from developing in people who have been exposed to the rabies virus. °CAUSES  °Rabies is caused by a virus that lives inside some animals. When a person is bitten by an infected animal, the rabies virus is spread to the person through the infected spit (saliva) of the animal. This virus can be carried by animals such as dogs, cats, skunks, bats, woodchucks, raccoons, coyotes, and foxes. °SYMPTOMS  °By the time symptoms appear, rabies is usually fatal for the person. Common symptoms include: °· Headache. °· Fever. °· Fatigue and weakness. °· Agitation. °· Anxiety. °· Confusion. °· Unusual behavior, such as hyperactivity, fear of water (hydrophobia), or fear of air (aerophobia). °· Hallucinations. °· Insomnia. °· Weakness in the arms or legs. °· Difficulty swallowing. °Most people get sick in 1-3 months after being bitten. This often varies and may depend on the location of the bite. The infection will take less time to develop if the bite occurred closer to the head.  °DIAGNOSIS  °To determine if a person is infected, several tests must be performed, such as: °· A skin biopsy. °· A saliva test. °· A lumbar puncture to remove spinal fluid so it can be examined. °· Blood tests. °TREATMENT  °Treatment to prevent the infection from developing (post-exposure prophylaxis, PEP) is often started before knowing for sure if the person has been exposed to the rabies virus. PEP involves cleaning the wound, giving an antibody injection  (rabies immune globulin), and giving a series of rabies vaccine injections. The series of injections are usually given over a two-week period. If possible, the animal that bit the person will be observed to see if it remains healthy. If the animal has been killed, it can be sent to a state laboratory and examined to see if the animal had rabies. °If a person is bitten by a domestic animal (dog, cat, or ferret) that appears healthy and can be observed to see if it remains healthy, often no further treatment is necessary other than care of the wounds caused by the animal. °Rabies is often a fatal illness once the infection develops in a person. Although a few people who developed rabies have survived after experimental treatment with certain drugs, all these survivors still had severe nervous system problems after the treatment. This is why caregivers use extra caution and begin PEP treatment for people who have been bitten by animals that are possibly infected with rabies.  °HOME CARE INSTRUCTIONS  °If you were bitten by an unknown animal, make sure you know your caregiver's instructions for follow-up. If the animal was sent to a laboratory for examination, ask when the test results will be ready. Make sure you get the test results.  °Take these steps to care for your wound: °· Keep the wound clean, dry, and dressed as directed by your caregiver. °· Keep the injured part elevated as much as possible. °· Do not resume use of the affected area until directed. °· Only take over-the-counter or prescription medicines as directed by your   caregiver.  Keep all follow-up appointments as directed by your caregiver. PREVENTION  To prevent rabies, people need to reduce their risk of having contact with infected animals.   Make sure your pets (dogs, cats, ferrets) are vaccinated against rabies. Keep these vaccinations up-to-date as directed by your veterinarian.  Supervise your pets when they are outside. Keep them away  from wild animals.  Call your local animal control services to report any stray animals. These animals may not be vaccinated.  Stay away from stray or wild animals.  Consider getting the rabies vaccine (preexposure) if you are traveling to an area where rabies is common or if your job or activities involve possible contact with wild or stray animals. Discuss this with your caregiver.   This information is not intended to replace advice given to you by your health care provider. Make sure you discuss any questions you have with your health care provider.   Document Released: 11/16/2005 Document Revised: 12/07/2014 Document Reviewed: 06/14/2012 Elsevier Interactive Patient Education 2016 [employer].   Rabies Vaccine: What You Need to Know  WHAT IS RABIES?  Rabies is a serious disease. It is caused by a virus.  Rabies is mainly a disease of animals. Humans get rabies when they are bitten by infected animals.  At first there might not be any symptoms. But weeks, or even years after a bite, rabies can cause pain, fatigue, headaches, fever, and irritability. These are followed by seizures, hallucinations, and paralysis. Human rabies is almost always fatal.  Wild animals, especially bats, are the most common source of human rabies infection in the [place]. Skunks, raccoons, dogs, cats, coyotes, foxes, and other mammals can also transmit the disease.  Human rabies is rare in the [place]. There have been only 4 cases diagnosed since 1990. However, between 16,000 and 39,000 people are vaccinated each year as a precaution after animal bites. Also, rabies is far more common in other parts of the world, with about 40,000 to 70,000 rabies-related deaths worldwide each year. Bites from unvaccinated dogs cause most of these cases. Rabies vaccine can prevent rabies.  RABIES VACCINE  Rabies vaccine is given to people at high risk of rabies to protect them if they are exposed. It can also prevent  the disease if it is given to a person after they have been exposed.  Rabies vaccine is made from killed rabies virus. It cannot cause rabies. WHO SHOULD GET RABIES VACCINE AND WHEN?  Preventive Vaccination (No Exposure)  People at high risk of exposure to rabies, such as veterinarians, Insurance account manager, rabies laboratory workers, spelunkers, and rabies biologics production workers should be offered rabies vaccine.  The vaccine should also be considered for:  People whose activities bring them into frequent contact with rabies virus or with possibly rabid animals.  International travelers who are likely to come in contact with animals in parts of the world where rabies is common. The pre-exposure schedule for rabies vaccination is 3 doses, given at the following times:  Dose 1: As appropriate.  Dose 2: 7 days after Dose 1.  Dose 3: 21 days or 28 days after Dose 1. For laboratory workers and others who may be repeatedly exposed to rabies virus, periodic testing for immunity is recommended and booster doses should be given as needed. (Testing or booster doses are not recommended for travelers). Ask your doctor for details. Vaccination After an Exposure  Anyone who has been bitten by an animal, or who otherwise may have been  exposed to rabies, should clean the wound and see a doctor immediately. The doctor will determine if they need to be vaccinated.  A person who is exposed and has never been vaccinated against rabies should get 4 doses of rabies vaccine: one dose right away and additional doses on the 3rd, 7th, and 14th days. They should also get another shot called Rabies Immune Globulin at the same time as the first dose.  A person who has been previously vaccinated should get 2 doses of rabies vaccine: one right away and another on the 3rd day. Rabies Immune Globulin is not needed.  TELL YOUR DOCTOR IF:  Talk with a doctor before getting rabies vaccine if you:  Ever had a serious  (life-threatening) allergic reaction to a previous dose of rabies vaccine or to any component of the vaccine; tell your doctor if you have any severe allergies.  Have a weakened immune system because of:  HIV, AIDS, or another disease that affects the immune system.  Treatment with drugs that affect the immune system, such as steroids.  Cancer or cancer treatment with radiation or drugs. If you have a minor illness, such as a cold, you can be vaccinated. If you are moderately or severely ill, you should probably wait until you recover before getting a routine (non-exposure) dose of rabies vaccine.  If you have been exposed to rabies virus, you should get the vaccine regardless of any other illnesses you may have.  WHAT ARE THE RISKS FROM RABIES VACCINE?  A vaccine, like any medicine, is capable of causing serious problems, such as severe allergic reactions. The risk of a vaccine causing serious harm, or death, is extremely small. Serious problems from rabies vaccine are very rare.  Mild problems:  Soreness, redness, swelling, or itching where the shot was given (30% to 74%).  Headache, nausea, abdominal pain, muscle aches, or dizziness (5% to 40%). Moderate problems:  Hives, pain in the joints, or fever (about 6% of booster doses).  Other nervous system disorders, such as Guillain-Barr Syndrome (GBS), have been reported after rabies vaccine, but this happens so rarely that it is not known whether they are related to the vaccine. Note: Several brands of rabies vaccine are available in the [place], and reactions may vary between brands. Your provider can give you more information about a particular brand.  WHAT IF THERE IS A SERIOUS REACTION?  What should I look for?  Look for anything that concerns you, such as signs of a severe allergic reaction, very high fever, or behavior changes.  Signs of a severe allergic reaction can include hives, swelling of the face and throat, difficulty  breathing, a fast heartbeat, dizziness, and weakness. These would start a few minutes to a few hours after the vaccination.  What should I do?  If you think it is a severe allergic reaction or other emergency that cannot wait, call 911 or get the person to the nearest hospital. Otherwise, call your doctor.  Afterward, the reaction should be reported to the Vaccine Adverse Event Reporting System (VAERS). Your doctor might file this report, or you can do it yourself through the VAERS website at www.vaers.SamedayNews.es or by calling 3397708233. VAERS is only for reporting reactions. They do not give medical advice.  HOW CAN I LEARN MORE?  Ask your doctor.  Call your local or state health department.  Contact the Centers for Disease Control and Prevention (CDC):  Visit the CDC rabies website at [place].no CDC Rabies Vaccine VIS (  09/04/08)  This information is not intended to replace advice given to you by your health care provider. Make sure you discuss any questions you have with your health care provider.  Document Released: 09/13/2006 Document Revised: 04/02/2015 Document Reviewed: 03/08/2013  Elsevier Interactive Patient Education [employer].

## 2016-05-08 ENCOUNTER — Encounter: Payer: Self-pay | Admitting: *Deleted

## 2016-05-08 ENCOUNTER — Ambulatory Visit
Admission: EM | Admit: 2016-05-08 | Discharge: 2016-05-08 | Disposition: A | Discharge status: Home / Self Care | Payer: Medicare Other | Attending: Family Medicine | Admitting: Family Medicine

## 2016-05-08 MED ORDER — RABIES VACCINE, PCEC IM SUSR
1.0000 mL | Freq: Once | INTRAMUSCULAR | Status: AC
  Administered 2016-05-08: 1 mL via INTRAMUSCULAR

## 2016-05-08 NOTE — ED Notes (Signed)
Here for Rabies vaccine.

## 2016-05-12 ENCOUNTER — Ambulatory Visit
Admission: EM | Admit: 2016-05-12 | Discharge: 2016-05-12 | Disposition: A | Discharge status: Home / Self Care | Payer: Medicare Other | Attending: Internal Medicine | Admitting: Internal Medicine

## 2016-05-12 ENCOUNTER — Encounter: Payer: Self-pay | Admitting: *Deleted

## 2016-05-12 DIAGNOSIS — Z2914 Encounter for prophylactic rabies immune globin: Secondary | ICD-10-CM

## 2016-05-12 DIAGNOSIS — Z203 Contact with and (suspected) exposure to rabies: Secondary | ICD-10-CM

## 2016-05-12 MED ORDER — RABIES VACCINE, PCEC IM SUSR
1.0000 mL | Freq: Once | INTRAMUSCULAR | Status: AC
  Administered 2016-05-12: 1 mL via INTRAMUSCULAR

## 2016-05-12 NOTE — ED Notes (Signed)
Rechecked patient in 15 minutes. No adverse reaction observed.

## 2016-05-12 NOTE — ED Notes (Signed)
Patient is here for 2nd follow up rabies vaccination. Patient does not report any adverse reactions to rabies vaccinations.

## 2016-05-19 DIAGNOSIS — Z203 Contact with and (suspected) exposure to rabies: Secondary | ICD-10-CM | POA: Diagnosis not present

## 2016-05-19 DIAGNOSIS — Z23 Encounter for immunization: Secondary | ICD-10-CM | POA: Diagnosis not present

## 2016-07-31 NOTE — Progress Notes (Signed)
Patient: Robert Fuller, Male    DOB: 1951/09/12, 65 y.o.   MRN: [id number] Visit Date: 08/04/2016  Today's Provider: [name], MD   Chief Complaint  Patient presents with  . Medicare Wellness   Subjective:   Robert Fuller is a 65 y.o. male who presents today for his Subsequent Annual Wellness Visit. He feels well. He reports exercising leg exercises. He reports he is sleeping well.  Last Endoscopy 04/06/16 Barrett's esophagus.  Colonoscopy 01/03/13 tubular adenoma  Tdap 07/06/12  Zoster 07/06/12 Review of Systems  Constitutional: Negative.   HENT: Positive for tinnitus.   Eyes: Negative.   Respiratory: Negative.   Cardiovascular: Negative.   Gastrointestinal: Negative.   Endocrine: Negative.   Genitourinary: Negative.   Musculoskeletal: Negative.   Skin: Negative.   Allergic/Immunologic: Negative.   Neurological: Negative.   Hematological: Negative.   Psychiatric/Behavioral: Negative.     Patient Active Problem List   Diagnosis Date Noted  . 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 G E R D 02/05/2011  . BARRETTS ESOPHAGUS 02/05/2011  . COUGH 02/05/2011  . Avitaminosis D 03/01/2009  . Chronic inflammation of tunica albuginea 11/30/1998    Social History   Social History  . Marital status: Married    Spouse name: N/A  . Number of children: N/A  . Years of education: N/A   Occupational History  . textiles    Social History Main Topics  . Smoking status: Never Smoker  . Smokeless tobacco: Never Used  . Alcohol use Yes     Comment: 2 to 3 times a week maybe 1 to 2 drinks at that time  . Drug use: No  . Sexual activity: Not on file   Other Topics Concern  . Not on file   Social History Narrative  . No narrative on file    Past Surgical History:  Procedure Laterality Date  . ESOPHAGOGASTRODUODENOSCOPY (EGD) WITH PROPOFOL N/A 04/06/2016   Procedure: ESOPHAGOGASTRODUODENOSCOPY (EGD) WITH  PROPOFOL;  Surgeon: Hulen Luster, MD;  Location: [facility] ENDOSCOPY;  Service: Gastroenterology;  Laterality: N/A;  . NO PAST SURGERIES    . UPPER GI ENDOSCOPY  04/06/16   normal other than esophageal changes present consistent with barretts esophagus, repeat 2019    His family history includes Lung cancer in his mother; Parkinsonism in his father.    Outpatient Medications Prior to Visit  Medication Sig Dispense Refill  . aspirin 81 MG tablet Take 81 mg by mouth daily.      Marland Kitchen esomeprazole (NEXIUM) 20 MG capsule Take 20 mg by mouth daily at 12 noon.    . minocycline (MINOCIN,DYNACIN) 100 MG capsule Take 1 capsule (100 mg total) by mouth 2 (two) times daily. 20 capsule 1  . omeprazole (PRILOSEC) 20 MG capsule Take 20 mg by mouth daily.       No facility-administered medications prior to visit.    .med Allergies  Allergen Reactions  . Cefprozil   . Ketorolac Tromethamine   . Morphine   . Sulfa Antibiotics     Patient Care Team: Jerrol Banana., MD as PCP - General (Family Medicine)  Objective:   Vitals:  Vitals:   08/04/16 1101  BP: 124/82  Pulse: 80  Resp: 12  Temp: 98.5 F (36.9 C)  Weight: 221 lb (100.2 kg)  Height: 6' 1.5" (1.867 m)    Physical Exam  Constitutional: He is oriented to person, place, and time.  He appears well-developed and well-nourished.  HENT:  Head: Normocephalic and atraumatic.  Right Ear: External ear normal.  Left Ear: External ear normal.  Eyes: Conjunctivae are normal. Pupils are equal, round, and reactive to light.  Neck: Normal range of motion. Neck supple.  Cardiovascular: Normal rate, regular rhythm, normal heart sounds and intact distal pulses.   No murmur heard. Pulmonary/Chest: Effort normal and breath sounds normal. No respiratory distress. He has no wheezes.  Abdominal: Soft. Bowel sounds are normal. He exhibits no distension. There is no tenderness.  Genitourinary: Rectum normal, prostate normal and penis normal. Rectal exam  shows guaiac negative stool. No penile tenderness.  Musculoskeletal: He exhibits no edema or tenderness.  Neurological: He is alert and oriented to person, place, and time.  Skin: Skin is warm and dry. No rash noted. No erythema.  Psychiatric: He has a normal mood and affect. His behavior is normal. Judgment and thought content normal.    Activities of Daily Living In your present state of health, do you have any difficulty performing the following activities: 08/04/2016  Hearing? Y  Vision? N  Difficulty concentrating or making decisions? N  Walking or climbing stairs? N  Dressing or bathing? N  Doing errands, shopping? N  Some recent data might be hidden    Fall Risk Assessment Fall Risk  08/04/2016  Falls in the past year? No     Depression Screen PHQ 2/9 Scores 08/04/2016  PHQ - 2 Score 0    Cognitive Testing - 6-CIT    Year: 0 4 points  Month: 0 3 points  Memorize "[place], [place], [address], [place]"  Time (within 1 hour:) 0 3 points  Count backwards from 20: 0 2 4 points  Name months of year: 0 2 4 points  Repeat Address: 0 2 4 6 8 10  points   Total Score: 3/28  Interpretation : Normal (0-7) Abnormal (8-28)  Is the patient deaf or have difficulty hearing?: Yes (a little) Does the patient have difficulty seeing, even when wearing glasses/contacts?: No Does the patient have difficulty concentrating, remembering, or making decisions?: No Does the patient have difficulty walking or climbing stairs?: No Does the patient have difficulty dressing or bathing?: No Does the patient have difficulty doing errands alone such as visiting a doctor's office or shopping?: No  Assessment & Plan:     Annual Wellness Visit  Reviewed patient's Family Medical History Reviewed and updated list of patient's medical providers Assessment of cognitive impairment was done Assessed patient's functional ability Established a written schedule for health screening [place] Completed and Reviewed  1. Medicare annual wellness visit, initial - EKG 12-Lead - TSH  2. Colon cancer screening  3. Pneumococcal vaccination declined 4. Influenza vaccination declined  5. Abnormal EKG Repeat EKG stable.  6. Barrett's esophagus without dysplasia - TSH  7. Hyperlipidemia - CBC w/Diff/Platelet - TSH - Lipid Panel With LDL/HDL Ratio  8. Hyperglycemia - HgB A1c - Comprehensive metabolic panel  HPI, exam and A&P transcribed under the presence and supervision of Miguel Aschoff, MD

## 2016-08-04 ENCOUNTER — Encounter: Payer: Self-pay | Admitting: Family Medicine

## 2016-08-04 ENCOUNTER — Ambulatory Visit (INDEPENDENT_AMBULATORY_CARE_PROVIDER_SITE_OTHER): Payer: Medicare Other | Admitting: Family Medicine

## 2016-08-04 VITALS — BP 124/82 | HR 80 | Temp 98.5°F | Resp 12 | Ht 73.5 in | Wt 221.0 lb

## 2016-08-04 DIAGNOSIS — Z0000 Encounter for general adult medical examination without abnormal findings: Secondary | ICD-10-CM | POA: Diagnosis not present

## 2016-08-04 DIAGNOSIS — R9431 Abnormal electrocardiogram [ECG] [EKG]: Secondary | ICD-10-CM

## 2016-08-04 DIAGNOSIS — R739 Hyperglycemia, unspecified: Secondary | ICD-10-CM

## 2016-08-04 DIAGNOSIS — E785 Hyperlipidemia, unspecified: Secondary | ICD-10-CM | POA: Diagnosis not present

## 2016-08-04 DIAGNOSIS — K2270 Barrett's esophagus without dysplasia: Secondary | ICD-10-CM | POA: Diagnosis not present

## 2016-08-04 DIAGNOSIS — Z1211 Encounter for screening for malignant neoplasm of colon: Secondary | ICD-10-CM

## 2016-08-04 DIAGNOSIS — N529 Male erectile dysfunction, unspecified: Secondary | ICD-10-CM | POA: Insufficient documentation

## 2016-08-04 DIAGNOSIS — Z2821 Immunization not carried out because of patient refusal: Secondary | ICD-10-CM | POA: Diagnosis not present

## 2016-08-04 LAB — IFOBT (OCCULT BLOOD): IFOBT: NEGATIVE

## 2016-08-05 LAB — HEMOGLOBIN A1C
Est. average glucose Bld gHb Est-mCnc: 120 mg/dL
HEMOGLOBIN A1C: 5.8 % — AB (ref 4.8–5.6)

## 2016-08-05 LAB — LIPID PANEL WITH LDL/HDL RATIO
CHOLESTEROL TOTAL: 163 mg/dL (ref 100–199)
HDL: 61 mg/dL (ref >39)
LDL CALC: 83 mg/dL (ref 0–99)
LDl/HDL Ratio: 1.4 ratio units (ref 0.0–3.6)
Triglycerides: 96 mg/dL (ref 0–149)
VLDL CHOLESTEROL CAL: 19 mg/dL (ref 5–40)

## 2016-08-05 LAB — COMPREHENSIVE METABOLIC PANEL
ALT: 21 IU/L (ref 0–44)
AST: 19 IU/L (ref 0–40)
Albumin/Globulin Ratio: 2.1 (ref 1.2–2.2)
Albumin: 4.4 g/dL (ref 3.6–4.8)
Alkaline Phosphatase: 71 IU/L (ref 39–117)
BUN/Creatinine Ratio: 14 (ref 10–24)
BUN: 13 mg/dL (ref 8–27)
Bilirubin Total: 1.6 mg/dL — ABNORMAL HIGH (ref 0.0–1.2)
CALCIUM: 9.1 mg/dL (ref 8.6–10.2)
CO2: 23 mmol/L (ref 18–29)
CREATININE: 0.92 mg/dL (ref 0.76–1.27)
Chloride: 103 mmol/L (ref 96–106)
GFR calc Af Amer: 100 mL/min/{1.73_m2} (ref >59)
GFR, EST NON AFRICAN AMERICAN: 86 mL/min/{1.73_m2} (ref >59)
GLOBULIN, TOTAL: 2.1 g/dL (ref 1.5–4.5)
Glucose: 109 mg/dL — ABNORMAL HIGH (ref 65–99)
Potassium: 4.4 mmol/L (ref 3.5–5.2)
SODIUM: 142 mmol/L (ref 134–144)
Total Protein: 6.5 g/dL (ref 6.0–8.5)

## 2016-08-05 LAB — CBC WITH DIFFERENTIAL/PLATELET
BASOS: 1 %
Basophils Absolute: 0 10*3/uL (ref 0.0–0.2)
EOS (ABSOLUTE): 0.1 10*3/uL (ref 0.0–0.4)
EOS: 3 %
HEMOGLOBIN: 15.9 g/dL (ref 12.6–17.7)
Hematocrit: 45.7 % (ref 37.5–51.0)
IMMATURE GRANS (ABS): 0 10*3/uL (ref 0.0–0.1)
Immature Granulocytes: 1 %
LYMPHS: 33 %
Lymphocytes Absolute: 1.4 10*3/uL (ref 0.7–3.1)
MCH: 32 pg (ref 26.6–33.0)
MCHC: 34.8 g/dL (ref 31.5–35.7)
MCV: 92 fL (ref 79–97)
MONOCYTES: 9 %
Monocytes Absolute: 0.4 10*3/uL (ref 0.1–0.9)
NEUTROS ABS: 2.3 10*3/uL (ref 1.4–7.0)
Neutrophils: 53 %
Platelets: 163 10*3/uL (ref 150–379)
RBC: 4.97 x10E6/uL (ref 4.14–5.80)
RDW: 14.4 % (ref 12.3–15.4)
WBC: 4.3 10*3/uL (ref 3.4–10.8)

## 2016-08-05 LAB — TSH: TSH: 1.78 u[IU]/mL (ref 0.450–4.500)

## 2016-08-06 ENCOUNTER — Telehealth: Payer: Self-pay

## 2016-08-06 NOTE — Telephone Encounter (Signed)
-----   Message from Jerrol Banana., MD sent at 08/05/2016 11:20 AM EDT ----- Labs in normal range.

## 2016-08-07 NOTE — Telephone Encounter (Signed)
Pt advised.   Thanks,   -Addelyn Alleman  

## 2016-12-02 DIAGNOSIS — D2261 Melanocytic nevi of right upper limb, including shoulder: Secondary | ICD-10-CM | POA: Diagnosis not present

## 2016-12-02 DIAGNOSIS — D2272 Melanocytic nevi of left lower limb, including hip: Secondary | ICD-10-CM | POA: Diagnosis not present

## 2016-12-02 DIAGNOSIS — D225 Melanocytic nevi of trunk: Secondary | ICD-10-CM | POA: Diagnosis not present

## 2016-12-02 DIAGNOSIS — L821 Other seborrheic keratosis: Secondary | ICD-10-CM | POA: Diagnosis not present

## 2017-01-20 DIAGNOSIS — R69 Illness, unspecified: Secondary | ICD-10-CM | POA: Diagnosis not present

## 2017-05-05 ENCOUNTER — Encounter: Payer: Self-pay | Admitting: Family Medicine

## 2017-05-05 ENCOUNTER — Ambulatory Visit (INDEPENDENT_AMBULATORY_CARE_PROVIDER_SITE_OTHER): Payer: Medicare HMO | Admitting: Family Medicine

## 2017-05-05 VITALS — BP 122/84 | HR 68 | Temp 98.1°F | Wt 225.0 lb

## 2017-05-05 DIAGNOSIS — J069 Acute upper respiratory infection, unspecified: Secondary | ICD-10-CM

## 2017-05-05 MED ORDER — HYDROCOD POLST-CPM POLST ER 10-8 MG/5ML PO SUER: 5.0000 mL | Freq: Two times a day (BID) | ORAL | 0 refills | Status: DC | PRN

## 2017-05-05 NOTE — Patient Instructions (Signed)
Continue Sudafed and add an expectorant like Mucinex. Let me know if not improving early next week.

## 2017-05-05 NOTE — Progress Notes (Signed)
Subjective:     Patient ID: Robert Fuller, male   DOB: Nov 11, 1951, 66 y.o.   MRN: [id number]  HPI  Chief Complaint  Patient presents with  . Cough    Productive cough started 1 week ago. Gradually worsening. Pt has been using NyQuil and Sudafed with mild relief.   . Nasal Congestion  States his wife was sick first after returning from a cruise. No fever, chills, or sore throat. Reports PND of clear drainage with cough productive of grey/blood tinged sputum.   Review of Systems     Objective:   Physical Exam  Constitutional: He appears well-developed and well-nourished. No distress.  Ears: T.M's obscured by cerumen Throat: no tonsillar enlargement or exudate Neck: no cervical adenopathy Lungs: clear     Assessment:    1. Viral upper respiratory tract infection - chlorpheniramine-HYDROcodone (TUSSIONEX PENNKINETIC ER) 10-8 MG/5ML SUER; Take 5 mLs by mouth every 12 (twelve) hours as needed for cough.  Dispense: 120 mL; Refill: 0    Plan:    Continue with Sudafed and expectorants. Call early next week if not improving.

## 2017-06-15 DIAGNOSIS — R69 Illness, unspecified: Secondary | ICD-10-CM | POA: Diagnosis not present

## 2017-06-15 DIAGNOSIS — H2513 Age-related nuclear cataract, bilateral: Secondary | ICD-10-CM | POA: Diagnosis not present

## 2017-06-29 DIAGNOSIS — R69 Illness, unspecified: Secondary | ICD-10-CM | POA: Diagnosis not present

## 2017-07-06 DIAGNOSIS — R351 Nocturia: Secondary | ICD-10-CM | POA: Diagnosis not present

## 2017-07-06 DIAGNOSIS — N492 Inflammatory disorders of scrotum: Secondary | ICD-10-CM | POA: Diagnosis not present

## 2017-07-28 DIAGNOSIS — R69 Illness, unspecified: Secondary | ICD-10-CM | POA: Diagnosis not present

## 2017-10-01 DIAGNOSIS — E669 Obesity, unspecified: Secondary | ICD-10-CM | POA: Diagnosis not present

## 2017-10-01 DIAGNOSIS — Z0000 Encounter for general adult medical examination without abnormal findings: Secondary | ICD-10-CM | POA: Diagnosis not present

## 2017-10-01 DIAGNOSIS — K219 Gastro-esophageal reflux disease without esophagitis: Secondary | ICD-10-CM | POA: Diagnosis not present

## 2017-10-01 DIAGNOSIS — Z7982 Long term (current) use of aspirin: Secondary | ICD-10-CM | POA: Diagnosis not present

## 2017-10-01 DIAGNOSIS — Z6839 Body mass index (BMI) 39.0-39.9, adult: Secondary | ICD-10-CM | POA: Diagnosis not present

## 2017-12-06 ENCOUNTER — Ambulatory Visit (INDEPENDENT_AMBULATORY_CARE_PROVIDER_SITE_OTHER): Payer: Medicare HMO | Admitting: Family Medicine

## 2017-12-06 ENCOUNTER — Encounter: Payer: Self-pay | Admitting: Family Medicine

## 2017-12-06 VITALS — BP 124/86 | HR 66 | Temp 98.5°F | Resp 16 | Wt 228.2 lb

## 2017-12-06 DIAGNOSIS — R1012 Left upper quadrant pain: Secondary | ICD-10-CM

## 2017-12-06 NOTE — Patient Instructions (Signed)
We will call you about the referral. 

## 2017-12-06 NOTE — Progress Notes (Signed)
Subjective:     Patient ID: Robert Fuller, male   DOB: October 08, 1951, 67 y.o.   MRN: [id number] Chief Complaint  Patient presents with  . Abdominal Pain    Patient comes in office today with complaints of abdominal pain around the mid abdominal area for the past 6 weeks. Patient states " it feels like someone is pushing on my stomach, when I lay on my right side at night I feel more pain." Patient reports gas, burping, growling of stomach and inigestion, he has troed taking otc Nexium with no relief.    HPI States eating and lying on his left side improve the discomfort. No change in bowel pattern. Hx of colon polyps but no diverticuli. Reported. Remains on Nexium due to hx of Barrett's esophagus. Review of Systems     Objective:   Physical Exam  Constitutional: He appears well-developed and well-nourished. No distress.  Abdominal: Bowel sounds are normal. There is tenderness (mild tenderness in one specific area of his LUQ).       Assessment:    1. Left upper quadrant pain - Ambulatory referral to Gastroenterology    Plan:    Further f/u pending G.I.consultation

## 2017-12-15 ENCOUNTER — Ambulatory Visit (INDEPENDENT_AMBULATORY_CARE_PROVIDER_SITE_OTHER): Payer: Medicare HMO | Admitting: Family Medicine

## 2017-12-15 ENCOUNTER — Encounter: Payer: Self-pay | Admitting: Family Medicine

## 2017-12-15 VITALS — BP 128/70 | HR 95 | Temp 98.9°F | Resp 16 | Wt 225.2 lb

## 2017-12-15 DIAGNOSIS — J069 Acute upper respiratory infection, unspecified: Secondary | ICD-10-CM

## 2017-12-15 NOTE — Patient Instructions (Signed)
Use Sudafed for congestion and Mucinex as an expectorant. Continue Tussionex cough syrup as needed. May also use Delsym for cough.

## 2017-12-15 NOTE — Progress Notes (Signed)
Subjective:     Patient ID: Robert Fuller, male   DOB: March 21, 1951, 67 y.o.   MRN: [id number] Chief Complaint  Patient presents with  . Cough    Patient comes in office today with concern of productive cough for the past 3 days, patient states that he has run a fever ranging from 99.5-100.7. Patient has tried taking prescription Tussionex, otc Allergy relief, oral decongestant, cold & sinus relief and Nyquil.    HPI Has supply of Tussionex cough syrup. States he was in contact with a sick friend prior to onset of his sx.  Review of Systems     Objective:   Physical Exam  Constitutional: He appears well-developed and well-nourished. No distress.  Ears: T.M's obscured by cerumen Throat: no tonsillar enlargement or exudate Neck: no cervical adenopathy Lungs: clear     Assessment:    1. Viral upper respiratory tract infection     Plan:    Discussed continued sx treatment and natural history of his illness.

## 2017-12-29 ENCOUNTER — Ambulatory Visit (INDEPENDENT_AMBULATORY_CARE_PROVIDER_SITE_OTHER): Payer: Medicare HMO

## 2017-12-29 ENCOUNTER — Ambulatory Visit: Payer: Medicare HMO

## 2017-12-29 VITALS — BP 126/82 | HR 76 | Temp 98.9°F | Ht 74.0 in | Wt 220.6 lb

## 2017-12-29 DIAGNOSIS — Z23 Encounter for immunization: Secondary | ICD-10-CM | POA: Diagnosis not present

## 2017-12-29 DIAGNOSIS — Z0000 Encounter for general adult medical examination without abnormal findings: Secondary | ICD-10-CM

## 2017-12-29 NOTE — Progress Notes (Addendum)
Subjective:   Robert Fuller is a 67 y.o. male who presents for Medicare Annual/Subsequent preventive examination.  Review of Systems:  N/A  Cardiac Risk Factors include: advanced age (>52men, >97 women);male gender     Objective:    Vitals: BP 126/82 (BP Location: Left Arm)   Pulse 76   Temp 98.9 F (37.2 C) (Oral)   Ht 6\' 2"  (1.88 m)   Wt 220 lb 9.6 oz (100.1 kg)   BMI 28.32 kg/m   Body mass index is 28.32 kg/m.  Advanced Directives 12/29/2017 12/29/2017 05/05/2016 04/30/2016 04/06/2016  Does Patient Have a Medical Advance Directive? No Yes No No No  Type of Advance Directive - [place]  Does patient want to make changes to medical advance directive? Yes (MAU/Ambulatory/Procedural Areas - Information given) - - - -  Copy of Healthcare Power of Attorney in Chart? - No - copy requested - - -  Would patient like information on creating a medical advance directive? - - No - patient declined information No - patient declined information No - patient declined information    Tobacco Social History   Tobacco Use  Smoking Status Never Smoker  Smokeless Tobacco Never Used     Counseling given: Not Answered   Clinical Intake:  Pre-visit preparation completed: Yes  Pain : No/denies pain Pain Score: 0-No pain     Nutritional Status: BMI 25 -29 Overweight Nutritional Risks: None Diabetes: No  How often do you need to have someone help you when you read instructions, pamphlets, or other written materials from your doctor or pharmacy?: 1 - Never  Interpreter Needed?: No  Information entered by :: [facility], LPN  Past Medical History:  Diagnosis Date  . Barrett's esophagus   . Cough   . Esophageal reflux   . Kidney stone    Past Surgical History:  Procedure Laterality Date  . ESOPHAGOGASTRODUODENOSCOPY (EGD) WITH PROPOFOL N/A 04/06/2016   Procedure: ESOPHAGOGASTRODUODENOSCOPY (EGD) WITH PROPOFOL;  Surgeon: Hulen Luster, MD;  Location[place] [facility]  [place];  Service: Gastroenterology;  Laterality: N/A;  . NO PAST SURGERIES    . UPPER GI ENDOSCOPY  04/06/16   normal other than esophageal changes present consistent with barretts esophagus, repeat 2019   Family History  Problem Relation Age of Onset  . Lung cancer Mother   . Parkinsonism Father    Social History   Socioeconomic History  . Marital status: Married    Spouse name: None  . Number of children: 2  . Years of education: None  . Highest education level: Bachelor's degree (e.g., BA, AB, BS)  Social Needs  . Financial resource strain: Not hard at all  . Food insecurity - worry: Never true  . Food insecurity - inability: Never true  . Transportation needs - medical: No  . Transportation needs - non-medical: No  Occupational History  . Occupation: Charity fundraiser    Comment: and rental properties  Tobacco Use  . Smoking status: Never Smoker  . Smokeless tobacco: Never Used  Substance and Sexual Activity  . Alcohol use: Yes    Alcohol/week: 2.4 - 3.6 oz    Types: 4 - 6 Shots of liquor per week  . Drug use: No  . Sexual activity: None  Other Topics Concern  . None  Social History Narrative  . None    Outpatient Encounter Medications as of 12/29/2017  Medication Sig  . aspirin 81 MG tablet Take 81 mg by mouth daily.    Marland Kitchen  esomeprazole (NEXIUM) 20 MG capsule Take 20 mg by mouth daily at 12 noon.   No facility-administered encounter medications on file as of 12/29/2017.     Activities of Daily Living In your present state of health, do you have any difficulty performing the following activities: 12/29/2017  Hearing? Y  Comment tinnitus  Vision? N  Difficulty concentrating or making decisions? N  Walking or climbing stairs? N  Dressing or bathing? N  Doing errands, shopping? N  Preparing Food and eating ? N  Using the Toilet? N  In the past six months, have you accidently leaked urine? N  Do you have problems with loss of bowel control? N  Managing your  Medications? N  Managing your Finances? N  Housekeeping or managing your Housekeeping? N  Some recent data might be hidden    Patient Care Team: Jerrol Banana., MD as PCP - General (Family Medicine)   Assessment:   This is a routine wellness examination for Robert Fuller.  Exercise Activities and Dietary recommendations Current Exercise Habits: Home exercise routine, Type of exercise: stretching, Time (Minutes): 25, Frequency (Times/Week): 5, Weekly Exercise (Minutes/Week): 125, Intensity: Mild, Exercise limited by: orthopedic condition(s)  Goals    . Exercise 3x per week (30 min per time)     Recommend starting to exercise 3 days a week for at least 30 minutes.        Fall Risk Fall Risk  12/29/2017 08/04/2016  Falls in the past year? No No   Is the patient's home free of loose throw rugs in walkways, pet beds, electrical cords, etc?   yes      Grab bars in the bathroom? yes      Handrails on the stairs?   yes      Adequate lighting?   yes  Timed Get Up and Go Performed: N/A  Depression Screen PHQ 2/9 Scores 12/29/2017 12/29/2017 08/04/2016  PHQ - 2 Score 0 0 0  PHQ- 9 Score 0 - -    Cognitive Function: Pt declined today. Had this completed recently with [employer].         Immunization History  Administered Date(s) Administered  . Pneumococcal Conjugate-13 12/29/2017  . Rabies, IM 05/05/2016, 05/08/2016, 05/12/2016  . Tdap 07/06/2012  . Zoster 07/06/2012    Qualifies for Shingles Vaccine? Due for Shingles vaccine. Declined my offer to administer today. Education has been provided regarding the importance of this vaccine. Pt has been advised to call her insurance company to determine her out of pocket expense. Advised she may also receive this vaccine at her local pharmacy or Health Dept. Verbalized acceptance and understanding.  Screening Tests Health Maintenance  Topic Date Due  . INFLUENZA VACCINE  06/30/2017  . COLONOSCOPY  01/03/2018  . PNA vac Low Risk Adult (2 of 2  - PPSV23) 12/29/2018  . TETANUS/TDAP  07/06/2022  . Hepatitis C Screening  Completed   Cancer Screenings: Lung: Low Dose CT Chest recommended if Age 57-80 years, 30 pack-year currently smoking OR have quit w/in 15years. Patient does not qualify. Colorectal: Up to date  Additional Screenings:  Hepatitis B/HIV/Syphillis: Pt declines today.  Hepatitis C Screening: Up to date    Plan:  I have personally reviewed and addressed the Medicare Annual Wellness questionnaire and have noted the following in the patient's chart:  A. Medical and social history B. Use of alcohol, tobacco or illicit drugs  C. Current medications and supplements D. Functional ability and status E.  Nutritional status F.  Physical activity G. Advance directives H. List of other physicians I.  Hospitalizations, surgeries, and ER visits in previous 12 months J.  [place] such as hearing and vision if needed, cognitive and depression L. Referrals and appointments - none  In addition, I have reviewed and discussed with patient certain preventive protocols, quality metrics, and best practice recommendations. A written personalized care plan for preventive services as well as general preventive health recommendations were provided to patient.  See attached scanned questionnaire for additional information.   Signed,  Fabio Neighbors, LPN Nurse Health Advisor   Nurse Recommendations: Pt declined the influenza vaccine today.

## 2017-12-29 NOTE — Patient Instructions (Addendum)
Robert Fuller , Thank you for taking time to come for your Medicare Wellness Visit. I appreciate your ongoing commitment to your health goals. Please review the following plan we discussed and let me know if I can assist you in the future.   Screening recommendations/referrals: Colonoscopy: Up to date Recommended yearly ophthalmology/optometry visit for glaucoma screening and checkup Recommended yearly dental visit for hygiene and checkup  Vaccinations: Influenza vaccine: Pt declines today.  Pneumococcal vaccine: Prevnar 13 completed today. Tdap vaccine: Up to date Shingles vaccine: Pt declines today.     Advanced directives: Advance directive discussed with you today. I have provided a copy for you to complete at home and have notarized. Once this is complete please bring a copy in to our office so we can scan it into your chart.  Conditions/risks identified: Recommend starting to exercise 3 days a week for at least 30 minutes.   Next appointment: 12/30/17 @ 10:00 AM  Preventive Care 68 Years and Older, Male Preventive care refers to lifestyle choices and visits with your health care provider that can promote health and wellness. What does preventive care include?  A yearly physical exam. This is also called an annual well check.  Dental exams once or twice a year.  Routine eye exams. Ask your health care provider how often you should have your eyes checked.  Personal lifestyle choices, including:  Daily care of your teeth and gums.  Regular physical activity.  Eating a healthy diet.  Avoiding tobacco and drug use.  Limiting alcohol use.  Practicing safe sex.  Taking low doses of aspirin every day.  Taking vitamin and mineral supplements as recommended by your health care provider. What happens during an annual well check? The services and screenings done by your health care provider during your annual well check will depend on your age, overall health, lifestyle risk  factors, and family history of disease. Counseling  Your health care provider may ask you questions about your:  Alcohol use.  Tobacco use.  Drug use.  Emotional well-being.  Home and relationship well-being.  Sexual activity.  Eating habits.  History of falls.  Memory and ability to understand (cognition).  Work and work Statistician. Screening  You may have the following tests or measurements:  Height, weight, and BMI.  Blood pressure.  Lipid and cholesterol levels. These may be checked every 5 years, or more frequently if you are over 32 years old.  Skin check.  Lung cancer screening. You may have this screening every year starting at age 12 if you have a 30-pack-year history of smoking and currently smoke or have quit within the past 15 years.  Fecal occult blood test (FOBT) of the stool. You may have this test every year starting at age 68.  Flexible sigmoidoscopy or colonoscopy. You may have a sigmoidoscopy every 5 years or a colonoscopy every 10 years starting at age 69.  Prostate cancer screening. Recommendations will vary depending on your family history and other risks.  Hepatitis C blood test.  Hepatitis B blood test.  Sexually transmitted disease (STD) testing.  Diabetes screening. This is done by checking your blood sugar (glucose) after you have not eaten for a while (fasting). You may have this done every 1-3 years.  Abdominal aortic aneurysm (AAA) screening. You may need this if you are a current or former smoker.  Osteoporosis. You may be screened starting at age 47 if you are at high risk. Talk with your health care provider about your  test results, treatment options, and if necessary, the need for more tests. Vaccines  Your health care provider may recommend certain vaccines, such as:  Influenza vaccine. This is recommended every year.  Tetanus, diphtheria, and acellular pertussis (Tdap, Td) vaccine. You may need a Td booster every 10  years.  Zoster vaccine. You may need this after age 33.  Pneumococcal 13-valent conjugate (PCV13) vaccine. One dose is recommended after age 60.  Pneumococcal polysaccharide (PPSV23) vaccine. One dose is recommended after age 61. Talk to your health care provider about which screenings and vaccines you need and how often you need them. This information is not intended to replace advice given to you by your health care provider. Make sure you discuss any questions you have with your health care provider. Document Released: 12/13/2015 Document Revised: 08/05/2016 Document Reviewed: 09/17/2015 Elsevier Interactive Patient Education  2017 [place] Prevention in the Home Falls can cause injuries. They can happen to people of all ages. There are many things you can do to make your home safe and to help prevent falls. What can I do on the outside of my home?  Regularly fix the edges of walkways and driveways and fix any cracks.  Remove anything that might make you trip as you walk through a door, such as a raised step or threshold.  Trim any bushes or trees on the path to your home.  Use bright outdoor lighting.  Clear any walking paths of anything that might make someone trip, such as rocks or tools.  Regularly check to see if handrails are loose or broken. Make sure that both sides of any steps have handrails.  Any raised decks and porches should have guardrails on the edges.  Have any leaves, snow, or ice cleared regularly.  Use sand or salt on walking paths during winter.  Clean up any spills in your garage right away. This includes oil or grease spills. What can I do in the bathroom?  Use night lights.  Install grab bars by the toilet and in the tub and shower. Do not use towel bars as grab bars.  Use non-skid mats or decals in the tub or shower.  If you need to sit down in the shower, use a plastic, non-slip stool.  Keep the floor dry. Clean up any water that  spills on the floor as soon as it happens.  Remove soap buildup in the tub or shower regularly.  Attach bath mats securely with double-sided non-slip rug tape.  Do not have throw rugs and other things on the floor that can make you trip. What can I do in the bedroom?  Use night lights.  Make sure that you have a light by your bed that is easy to reach.  Do not use any sheets or blankets that are too big for your bed. They should not hang down onto the floor.  Have a firm chair that has side arms. You can use this for support while you get dressed.  Do not have throw rugs and other things on the floor that can make you trip. What can I do in the kitchen?  Clean up any spills right away.  Avoid walking on wet floors.  Keep items that you use a lot in easy-to-reach places.  If you need to reach something above you, use a strong step stool that has a grab bar.  Keep electrical cords out of the way.  Do not use floor polish or wax that  makes floors slippery. If you must use wax, use non-skid floor wax.  Do not have throw rugs and other things on the floor that can make you trip. What can I do with my stairs?  Do not leave any items on the stairs.  Make sure that there are handrails on both sides of the stairs and use them. Fix handrails that are broken or loose. Make sure that handrails are as long as the stairways.  Check any carpeting to make sure that it is firmly attached to the stairs. Fix any carpet that is loose or worn.  Avoid having throw rugs at the top or bottom of the stairs. If you do have throw rugs, attach them to the floor with carpet tape.  Make sure that you have a light switch at the top of the stairs and the bottom of the stairs. If you do not have them, ask someone to add them for you. What else can I do to help prevent falls?  Wear shoes that:  Do not have high heels.  Have rubber bottoms.  Are comfortable and fit you well.  Are closed at the  toe. Do not wear sandals.  If you use a stepladder:  Make sure that it is fully opened. Do not climb a closed stepladder.  Make sure that both sides of the stepladder are locked into place.  Ask someone to hold it for you, if possible.  Clearly mark and make sure that you can see:  Any grab bars or handrails.  First and last steps.  Where the edge of each step is.  Use tools that help you move around (mobility aids) if they are needed. These include:  Canes.  Walkers.  Scooters.  Crutches.  Turn on the lights when you go into a dark area. Replace any light bulbs as soon as they burn out.  Set up your furniture so you have a clear path. Avoid moving your furniture around.  If any of your floors are uneven, fix them.  If there are any pets around you, be aware of where they are.  Review your medicines with your doctor. Some medicines can make you feel dizzy. This can increase your chance of falling. Ask your doctor what other things that you can do to help prevent falls. This information is not intended to replace advice given to you by your health care provider. Make sure you discuss any questions you have with your health care provider. Document Released: 09/12/2009 Document Revised: 04/23/2016 Document Reviewed: 12/21/2014 Elsevier Interactive Patient Education  2017 [employer].

## 2017-12-30 ENCOUNTER — Ambulatory Visit (INDEPENDENT_AMBULATORY_CARE_PROVIDER_SITE_OTHER): Payer: Medicare HMO | Admitting: Family Medicine

## 2017-12-30 VITALS — BP 120/72 | HR 62 | Temp 98.7°F | Resp 16 | Wt 222.0 lb

## 2017-12-30 DIAGNOSIS — Z1211 Encounter for screening for malignant neoplasm of colon: Secondary | ICD-10-CM

## 2017-12-30 DIAGNOSIS — R739 Hyperglycemia, unspecified: Secondary | ICD-10-CM | POA: Diagnosis not present

## 2017-12-30 DIAGNOSIS — Z0000 Encounter for general adult medical examination without abnormal findings: Secondary | ICD-10-CM | POA: Diagnosis not present

## 2017-12-30 DIAGNOSIS — Z2821 Immunization not carried out because of patient refusal: Secondary | ICD-10-CM

## 2017-12-30 DIAGNOSIS — K2270 Barrett's esophagus without dysplasia: Secondary | ICD-10-CM

## 2017-12-30 LAB — IFOBT (OCCULT BLOOD): IFOBT: NEGATIVE

## 2017-12-30 NOTE — Progress Notes (Signed)
Patient: Robert Fuller, Male    DOB: [birth year], 67 y.o.   MRN: [id number] Visit Date: 12/30/2017  Today's Provider: [name], MD   Chief Complaint  Patient presents with  . Annual Exam   Subjective:  Robert Fuller is a 67 y.o. male who presents today for health maintenance and complete physical. He feels well. He reports exercising stretching about 25 minutes each time about 5 times a week. He reports he is sleeping well.  Patient saw nurse advisor on 12/29/2017 for Wellness Visit. Last Colonoscopy was 01/03/2013 Dr Candace Cruise, showed barrett's, repeat in 2019. Patient did have Endoscopy in May 2017. Last lab work was done on 08/04/2016 Fall Risk  12/29/2017 08/04/2016  Falls in the past year? No No   Review of Systems  Constitutional: Negative.   HENT: Positive for tinnitus.   Eyes: Negative.   Respiratory: Positive for cough.        Snores  Cardiovascular: Negative.   Gastrointestinal: Negative.   Endocrine: Negative.   Genitourinary: Negative.   Musculoskeletal: Negative.   Skin: Negative.   Allergic/Immunologic: Negative.   Neurological: Negative.   Hematological: Negative.   Psychiatric/Behavioral: Negative.     Social History   Socioeconomic History  . Marital status: Married    Spouse name: Not on file  . Number of children: 2  . Years of education: Not on file  . Highest education level: Bachelor's degree (e.g., BA, AB, BS)  Social Needs  . Financial resource strain: Not hard at all  . Food insecurity - worry: Never true  . Food insecurity - inability: Never true  . Transportation needs - medical: No  . Transportation needs - non-medical: No  Occupational History  . Occupation: Charity fundraiser    Comment: and rental properties  Tobacco Use  . Smoking status: Never Smoker  . Smokeless tobacco: Never Used  Substance and Sexual Activity  . Alcohol use: Yes    Alcohol/week: 2.4 - 3.6 oz    Types: 4 - 6 Shots of liquor per week  . Drug use: No  . Sexual activity: Not  on file  Other Topics Concern  . Not on file  Social History Narrative  . Not on file    Patient Active Problem List   Diagnosis Date Noted  . 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 G E R D 02/05/2011  . BARRETTS ESOPHAGUS 02/05/2011  . COUGH 02/05/2011  . Avitaminosis D 03/01/2009  . 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 ESOPHAGOGASTRODUODENOSCOPY (EGD) WITH PROPOFOL N/A 04/06/2016   Procedure: ESOPHAGOGASTRODUODENOSCOPY (EGD) WITH PROPOFOL;  Surgeon: Hulen Luster, MD;  Location[facility] ENDOSCOPY;  Service: Gastroenterology;  Laterality: N/A;  . NO PAST SURGERIES    . UPPER GI ENDOSCOPY  04/06/16   normal other than esophageal changes present consistent with barretts esophagus, repeat 2019    His family history includes Lung cancer in his mother; Parkinsonism in his father.     Outpatient Encounter Medications as of 12/30/2017  Medication Sig  . aspirin 81 MG tablet Take 81 mg by mouth daily.    Marland Kitchen esomeprazole (NEXIUM) 20 MG capsule Take 20 mg by mouth daily at 12 noon.   No facility-administered encounter medications on file as of 12/30/2017.     Patient Care Team: Jerrol Banana., MD as PCP - General (Family Medicine)  Objective:   Vitals:  Vitals:   12/30/17 1013  BP: 120/72  Pulse: 62  Resp: 16  Temp: 98.7 F (37.1 C)  Weight: 222 lb (100.7 kg)    Physical Exam  Constitutional: He is oriented to person, place, and time. He appears well-developed and well-nourished.  HENT:  Head: Normocephalic and atraumatic.  Right Ear: External ear normal.  Left Ear: External ear normal.  Nose: Nose normal.  Mouth/Throat: Oropharynx is clear and moist.  Eyes: Conjunctivae are normal. No scleral icterus.  Neck: No thyromegaly present.  Cardiovascular: Normal rate, regular rhythm and normal heart sounds.  Pulmonary/Chest: Effort  normal and breath sounds normal.  Abdominal: Soft.  Genitourinary: Rectum normal, prostate normal and penis normal.  Neurological: He is alert and oriented to person, place, and time.  Skin: Skin is warm and dry.  Psychiatric: He has a normal mood and affect. His behavior is normal. Judgment and thought content normal.     Depression Screen PHQ 2/9 Scores 12/29/2017 12/29/2017 08/04/2016  PHQ - 2 Score 0 0 0  PHQ- 9 Score 0 - -    Assessment & Plan:  1. Annual physical exam - CBC with Differential/Platelet - Comprehensive metabolic panel - TSH - Lipid Panel With LDL/HDL Ratio  2. Colon cancer screening - Ambulatory referral to Gastroenterology  3. Hyperglycemia - HgB A1c  4. Influenza vaccination declined  5. Barrett's esophagus without dysplasia Refer to Dr Oletta Lamas, need colonoscopy, endoscopy probably and follow up on Barrett's. - Ambulatory referral to Gastroenterology  HPI, Exam and A&P transcribed by Tiffany Kocher, RMA under direction and in the presence of Miguel Aschoff, MD. I have done the exam and reviewed the chart and it is accurate to the best of my knowledge. Development worker, community has been used and  any errors in dictation or transcription are unintentional. Miguel Aschoff M.D. [facility]

## 2017-12-31 DIAGNOSIS — R739 Hyperglycemia, unspecified: Secondary | ICD-10-CM | POA: Diagnosis not present

## 2017-12-31 DIAGNOSIS — Z0000 Encounter for general adult medical examination without abnormal findings: Secondary | ICD-10-CM | POA: Diagnosis not present

## 2018-01-01 LAB — HEMOGLOBIN A1C
ESTIMATED AVERAGE GLUCOSE: 123 mg/dL
Hgb A1c MFr Bld: 5.9 % — ABNORMAL HIGH (ref 4.8–5.6)

## 2018-01-01 LAB — COMPREHENSIVE METABOLIC PANEL
A/G RATIO: 2 (ref 1.2–2.2)
ALT: 29 IU/L (ref 0–44)
AST: 21 IU/L (ref 0–40)
Albumin: 4.2 g/dL (ref 3.6–4.8)
Alkaline Phosphatase: 78 IU/L (ref 39–117)
BILIRUBIN TOTAL: 1 mg/dL (ref 0.0–1.2)
BUN/Creatinine Ratio: 12 (ref 10–24)
BUN: 11 mg/dL (ref 8–27)
CALCIUM: 9 mg/dL (ref 8.6–10.2)
CHLORIDE: 107 mmol/L — AB (ref 96–106)
CO2: 21 mmol/L (ref 20–29)
Creatinine, Ser: 0.93 mg/dL (ref 0.76–1.27)
GFR, EST AFRICAN AMERICAN: 98 mL/min/{1.73_m2} (ref >59)
GFR, EST NON AFRICAN AMERICAN: 85 mL/min/{1.73_m2} (ref >59)
GLOBULIN, TOTAL: 2.1 g/dL (ref 1.5–4.5)
Glucose: 123 mg/dL — ABNORMAL HIGH (ref 65–99)
POTASSIUM: 4.4 mmol/L (ref 3.5–5.2)
Sodium: 144 mmol/L (ref 134–144)
TOTAL PROTEIN: 6.3 g/dL (ref 6.0–8.5)

## 2018-01-01 LAB — CBC WITH DIFFERENTIAL/PLATELET
BASOS: 1 %
Basophils Absolute: 0 10*3/uL (ref 0.0–0.2)
EOS (ABSOLUTE): 0.1 10*3/uL (ref 0.0–0.4)
Eos: 3 %
HEMATOCRIT: 41.9 % (ref 37.5–51.0)
HEMOGLOBIN: 15.1 g/dL (ref 13.0–17.7)
IMMATURE GRANS (ABS): 0 10*3/uL (ref 0.0–0.1)
IMMATURE GRANULOCYTES: 0 %
LYMPHS: 35 %
Lymphocytes Absolute: 1.2 10*3/uL (ref 0.7–3.1)
MCH: 32.5 pg (ref 26.6–33.0)
MCHC: 36 g/dL — ABNORMAL HIGH (ref 31.5–35.7)
MCV: 90 fL (ref 79–97)
Monocytes Absolute: 0.3 10*3/uL (ref 0.1–0.9)
Monocytes: 9 %
NEUTROS ABS: 1.7 10*3/uL (ref 1.4–7.0)
NEUTROS PCT: 52 %
PLATELETS: 203 10*3/uL (ref 150–379)
RBC: 4.65 x10E6/uL (ref 4.14–5.80)
RDW: 14.1 % (ref 12.3–15.4)
WBC: 3.3 10*3/uL — ABNORMAL LOW (ref 3.4–10.8)

## 2018-01-01 LAB — LIPID PANEL WITH LDL/HDL RATIO
Cholesterol, Total: 139 mg/dL (ref 100–199)
HDL: 48 mg/dL (ref >39)
LDL CALC: 78 mg/dL (ref 0–99)
LDl/HDL Ratio: 1.6 ratio (ref 0.0–3.6)
Triglycerides: 64 mg/dL (ref 0–149)
VLDL Cholesterol Cal: 13 mg/dL (ref 5–40)

## 2018-01-01 LAB — TSH: TSH: 1.56 u[IU]/mL (ref 0.450–4.500)

## 2018-01-04 ENCOUNTER — Telehealth: Payer: Self-pay | Admitting: Emergency Medicine

## 2018-01-04 NOTE — Telephone Encounter (Signed)
-----   Message from Jerrol Banana., MD sent at 01/04/2018 10:03 AM EST ----- Labs OK but pt prediabetic--D and E and RTC 4 months.

## 2018-01-04 NOTE — Telephone Encounter (Signed)
LMTCB

## 2018-01-06 NOTE — Telephone Encounter (Signed)
Pt requesting information on hyperglycemia. This is up front to pick up.

## 2018-01-06 NOTE — Telephone Encounter (Signed)
Pt advised.

## 2018-01-18 DIAGNOSIS — Z86010 Personal history of colonic polyps: Secondary | ICD-10-CM | POA: Diagnosis not present

## 2018-01-18 DIAGNOSIS — K2270 Barrett's esophagus without dysplasia: Secondary | ICD-10-CM | POA: Diagnosis not present

## 2018-01-25 DIAGNOSIS — R69 Illness, unspecified: Secondary | ICD-10-CM | POA: Diagnosis not present

## 2018-03-16 ENCOUNTER — Encounter: Payer: Self-pay | Admitting: Family Medicine

## 2018-03-16 DIAGNOSIS — K449 Diaphragmatic hernia without obstruction or gangrene: Secondary | ICD-10-CM | POA: Diagnosis not present

## 2018-03-16 DIAGNOSIS — K317 Polyp of stomach and duodenum: Secondary | ICD-10-CM | POA: Diagnosis not present

## 2018-03-16 DIAGNOSIS — K640 First degree hemorrhoids: Secondary | ICD-10-CM | POA: Diagnosis not present

## 2018-03-16 DIAGNOSIS — K2270 Barrett's esophagus without dysplasia: Secondary | ICD-10-CM | POA: Diagnosis not present

## 2018-03-16 DIAGNOSIS — K2950 Unspecified chronic gastritis without bleeding: Secondary | ICD-10-CM | POA: Diagnosis not present

## 2018-03-16 DIAGNOSIS — D120 Benign neoplasm of cecum: Secondary | ICD-10-CM | POA: Diagnosis not present

## 2018-03-16 DIAGNOSIS — K3189 Other diseases of stomach and duodenum: Secondary | ICD-10-CM | POA: Diagnosis not present

## 2018-03-16 DIAGNOSIS — D122 Benign neoplasm of ascending colon: Secondary | ICD-10-CM | POA: Diagnosis not present

## 2018-03-16 DIAGNOSIS — D126 Benign neoplasm of colon, unspecified: Secondary | ICD-10-CM | POA: Diagnosis not present

## 2018-03-16 DIAGNOSIS — Z86010 Personal history of colonic polyps: Secondary | ICD-10-CM | POA: Diagnosis not present

## 2018-03-16 DIAGNOSIS — K2990 Gastroduodenitis, unspecified, without bleeding: Secondary | ICD-10-CM | POA: Diagnosis not present

## 2018-03-16 DIAGNOSIS — K2970 Gastritis, unspecified, without bleeding: Secondary | ICD-10-CM | POA: Diagnosis not present

## 2018-03-16 DIAGNOSIS — K210 Gastro-esophageal reflux disease with esophagitis: Secondary | ICD-10-CM | POA: Diagnosis not present

## 2018-03-16 DIAGNOSIS — K648 Other hemorrhoids: Secondary | ICD-10-CM | POA: Diagnosis not present

## 2018-03-16 DIAGNOSIS — D123 Benign neoplasm of transverse colon: Secondary | ICD-10-CM | POA: Diagnosis not present

## 2018-03-16 DIAGNOSIS — K319 Disease of stomach and duodenum, unspecified: Secondary | ICD-10-CM | POA: Diagnosis not present

## 2018-03-16 LAB — HM COLONOSCOPY

## 2018-04-15 DIAGNOSIS — D2262 Melanocytic nevi of left upper limb, including shoulder: Secondary | ICD-10-CM | POA: Diagnosis not present

## 2018-04-15 DIAGNOSIS — D2272 Melanocytic nevi of left lower limb, including hip: Secondary | ICD-10-CM | POA: Diagnosis not present

## 2018-04-15 DIAGNOSIS — D225 Melanocytic nevi of trunk: Secondary | ICD-10-CM | POA: Diagnosis not present

## 2018-04-15 DIAGNOSIS — S70362A Insect bite (nonvenomous), left thigh, initial encounter: Secondary | ICD-10-CM | POA: Diagnosis not present

## 2018-04-15 DIAGNOSIS — D2261 Melanocytic nevi of right upper limb, including shoulder: Secondary | ICD-10-CM | POA: Diagnosis not present

## 2018-04-15 DIAGNOSIS — D2271 Melanocytic nevi of right lower limb, including hip: Secondary | ICD-10-CM | POA: Diagnosis not present

## 2018-07-27 DIAGNOSIS — R69 Illness, unspecified: Secondary | ICD-10-CM | POA: Diagnosis not present

## 2018-08-04 DIAGNOSIS — H2513 Age-related nuclear cataract, bilateral: Secondary | ICD-10-CM | POA: Diagnosis not present

## 2018-08-29 ENCOUNTER — Ambulatory Visit (INDEPENDENT_AMBULATORY_CARE_PROVIDER_SITE_OTHER): Payer: Medicare HMO | Admitting: Family Medicine

## 2018-08-29 ENCOUNTER — Encounter: Payer: Self-pay | Admitting: Family Medicine

## 2018-08-29 VITALS — BP 130/80 | HR 68 | Temp 98.8°F | Resp 16 | Wt 224.0 lb

## 2018-08-29 DIAGNOSIS — G44329 Chronic post-traumatic headache, not intractable: Secondary | ICD-10-CM

## 2018-08-29 DIAGNOSIS — H2513 Age-related nuclear cataract, bilateral: Secondary | ICD-10-CM | POA: Diagnosis not present

## 2018-08-29 DIAGNOSIS — S0990XA Unspecified injury of head, initial encounter: Secondary | ICD-10-CM | POA: Diagnosis not present

## 2018-08-29 NOTE — Progress Notes (Signed)
  Subjective:     Patient ID: Robert Fuller, male   DOB: Apr 05, 1951, 67 y.o.   MRN: [id number] Chief Complaint  Patient presents with  . Head Injury    Patient comes in office today with complaint of head injury that occurred on 07/31/18. Patient states that he was mowing the lawn when he smacked his head on a tree limb. Patient states that he had pain across forehead but continued to mow, he states that he applied ice and took tylenol. Patient reports following incident ringing int he ears, frequent headaches and states that a nurse he was friends with noticed that his right eye seemed to be lower than the left when he had these headahes.    HPI States he has been getting up to 3 frontal headaches a week which abate with cool compresses and ibuprofen. No prior hx of headaches.  Review of Systems     Objective:   Physical Exam  Constitutional: He appears well-developed and well-nourished. No distress.  HENT:  No hemotympanum  Eyes: Pupils are equal, round, and reactive to light. EOM are normal.  Neurological: Abnormal coordination:  finger to nose, heel to toe, and Romberg WNL.       Assessment:    1. Traumatic injury of head, initial encounter  2. Chronic post-traumatic headache, not intractable - CT Head Wo Contrast; Future    Plan:    Continue current treatment of headaches Further f/u pending CT scan results.

## 2018-08-29 NOTE — Patient Instructions (Signed)
We will call you about the CT scan scheduling. Continue your current treatment of headaches when they occur.

## 2018-09-06 ENCOUNTER — Ambulatory Visit
Admission: RE | Admit: 2018-09-06 | Discharge: 2018-09-06 | Disposition: A | Discharge status: Home / Self Care | Payer: Medicare HMO | Source: Ambulatory Visit | Attending: Family Medicine | Admitting: Family Medicine

## 2018-09-06 DIAGNOSIS — R51 Headache: Secondary | ICD-10-CM | POA: Diagnosis not present

## 2018-09-06 DIAGNOSIS — G44329 Chronic post-traumatic headache, not intractable: Secondary | ICD-10-CM

## 2018-09-06 DIAGNOSIS — S0990XA Unspecified injury of head, initial encounter: Secondary | ICD-10-CM | POA: Diagnosis not present

## 2019-02-28 ENCOUNTER — Ambulatory Visit: Payer: Medicare HMO

## 2019-02-28 ENCOUNTER — Encounter: Payer: Medicare HMO | Admitting: Family Medicine

## 2019-04-14 DIAGNOSIS — D225 Melanocytic nevi of trunk: Secondary | ICD-10-CM | POA: Diagnosis not present

## 2019-04-14 DIAGNOSIS — L853 Xerosis cutis: Secondary | ICD-10-CM | POA: Diagnosis not present

## 2019-04-14 DIAGNOSIS — D2261 Melanocytic nevi of right upper limb, including shoulder: Secondary | ICD-10-CM | POA: Diagnosis not present

## 2019-04-14 DIAGNOSIS — D2262 Melanocytic nevi of left upper limb, including shoulder: Secondary | ICD-10-CM | POA: Diagnosis not present

## 2019-04-14 DIAGNOSIS — D2271 Melanocytic nevi of right lower limb, including hip: Secondary | ICD-10-CM | POA: Diagnosis not present

## 2019-04-14 DIAGNOSIS — D2272 Melanocytic nevi of left lower limb, including hip: Secondary | ICD-10-CM | POA: Diagnosis not present

## 2019-05-23 DIAGNOSIS — R69 Illness, unspecified: Secondary | ICD-10-CM | POA: Diagnosis not present

## 2019-07-10 ENCOUNTER — Ambulatory Visit (INDEPENDENT_AMBULATORY_CARE_PROVIDER_SITE_OTHER): Payer: Medicare HMO

## 2019-07-10 ENCOUNTER — Other Ambulatory Visit: Payer: Self-pay

## 2019-07-10 DIAGNOSIS — Z0000 Encounter for general adult medical examination without abnormal findings: Secondary | ICD-10-CM | POA: Diagnosis not present

## 2019-07-10 NOTE — Patient Instructions (Signed)
Robert Fuller , Thank you for taking time to come for your Medicare Wellness Visit. I appreciate your ongoing commitment to your health goals. Please review the following plan we discussed and let me know if I can assist you in the future.   Screening recommendations/referrals: Colonoscopy: Up to date, due 042022 Recommended yearly ophthalmology/optometry visit for glaucoma screening and checkup Recommended yearly dental visit for hygiene and checkup  Vaccinations: Influenza vaccine: Due fall 2020 Pneumococcal vaccine: Pneumovax 23 due.  Tdap vaccine: Up to date, due 06/2022 Shingles vaccine: Pt declines today.     Advanced directives: Please bring a copy of your POA (Power of Attorney) and/or Living Will to your next appointment.   Conditions/risks identified: Continue to try to exercise 3 days a week for at least 30 minutes at a time.   Next appointment: 07/18/19 @ 2:00 PM with Dr Rosanna Randy.   Preventive Care 20 Years and Older, Male Preventive care refers to lifestyle choices and visits with your health care provider that can promote health and wellness. What does preventive care include?  A yearly physical exam. This is also called an annual well check.  Dental exams once or twice a year.  Routine eye exams. Ask your health care provider how often you should have your eyes checked.  Personal lifestyle choices, including:  Daily care of your teeth and gums.  Regular physical activity.  Eating a healthy diet.  Avoiding tobacco and drug use.  Limiting alcohol use.  Practicing safe sex.  Taking low doses of aspirin every day.  Taking vitamin and mineral supplements as recommended by your health care provider. What happens during an annual well check? The services and screenings done by your health care provider during your annual well check will depend on your age, overall health, lifestyle risk factors, and family history of disease. Counseling  Your health care provider  may ask you questions about your:  Alcohol use.  Tobacco use.  Drug use.  Emotional well-being.  Home and relationship well-being.  Sexual activity.  Eating habits.  History of falls.  Memory and ability to understand (cognition).  Work and work Statistician. Screening  You may have the following tests or measurements:  Height, weight, and BMI.  Blood pressure.  Lipid and cholesterol levels. These may be checked every 5 years, or more frequently if you are over 68 years old.  Skin check.  Lung cancer screening. You may have this screening every year starting at age 96 if you have a 30-pack-year history of smoking and currently smoke or have quit within the past 15 years.  Fecal occult blood test (FOBT) of the stool. You may have this test every year starting at age 74.  Flexible sigmoidoscopy or colonoscopy. You may have a sigmoidoscopy every 5 years or a colonoscopy every 10 years starting at age 105.  Prostate cancer screening. Recommendations will vary depending on your family history and other risks.  Hepatitis C blood test.  Hepatitis B blood test.  Sexually transmitted disease (STD) testing.  Diabetes screening. This is done by checking your blood sugar (glucose) after you have not eaten for a while (fasting). You may have this done every 1-3 years.  Abdominal aortic aneurysm (AAA) screening. You may need this if you are a current or former smoker.  Osteoporosis. You may be screened starting at age 71 if you are at high risk. Talk with your health care provider about your test results, treatment options, and if necessary, the need for more  tests. Vaccines  Your health care provider may recommend certain vaccines, such as:  Influenza vaccine. This is recommended every year.  Tetanus, diphtheria, and acellular pertussis (Tdap, Td) vaccine. You may need a Td booster every 10 years.  Zoster vaccine. You may need this after age 57.  Pneumococcal 13-valent  conjugate (PCV13) vaccine. One dose is recommended after age 46.  Pneumococcal polysaccharide (PPSV23) vaccine. One dose is recommended after age 2. Talk to your health care provider about which screenings and vaccines you need and how often you need them. This information is not intended to replace advice given to you by your health care provider. Make sure you discuss any questions you have with your health care provider. Document Released: 12/13/2015 Document Revised: 08/05/2016 Document Reviewed: 09/17/2015 Elsevier Interactive Patient Education  2017 [employer] Prevention in the Home Falls can cause injuries. They can happen to people of all ages. There are many things you can do to make your home safe and to help prevent falls. What can I do on the outside of my home?  Regularly fix the edges of walkways and driveways and fix any cracks.  Remove anything that might make you trip as you walk through a door, such as a raised step or threshold.  Trim any bushes or trees on the path to your home.  Use bright outdoor lighting.  Clear any walking paths of anything that might make someone trip, such as rocks or tools.  Regularly check to see if handrails are loose or broken. Make sure that both sides of any steps have handrails.  Any raised decks and porches should have guardrails on the edges.  Have any leaves, snow, or ice cleared regularly.  Use sand or salt on walking paths during winter.  Clean up any spills in your garage right away. This includes oil or grease spills. What can I do in the bathroom?  Use night lights.  Install grab bars by the toilet and in the tub and shower. Do not use towel bars as grab bars.  Use non-skid mats or decals in the tub or shower.  If you need to sit down in the shower, use a plastic, non-slip stool.  Keep the floor dry. Clean up any water that spills on the floor as soon as it happens.  Remove soap buildup in the tub or  shower regularly.  Attach bath mats securely with double-sided non-slip rug tape.  Do not have throw rugs and other things on the floor that can make you trip. What can I do in the bedroom?  Use night lights.  Make sure that you have a light by your bed that is easy to reach.  Do not use any sheets or blankets that are too big for your bed. They should not hang down onto the floor.  Have a firm chair that has side arms. You can use this for support while you get dressed.  Do not have throw rugs and other things on the floor that can make you trip. What can I do in the kitchen?  Clean up any spills right away.  Avoid walking on wet floors.  Keep items that you use a lot in easy-to-reach places.  If you need to reach something above you, use a strong step stool that has a grab bar.  Keep electrical cords out of the way.  Do not use floor polish or wax that makes floors slippery. If you must use wax, use non-skid floor  wax.  Do not have throw rugs and other things on the floor that can make you trip. What can I do with my stairs?  Do not leave any items on the stairs.  Make sure that there are handrails on both sides of the stairs and use them. Fix handrails that are broken or loose. Make sure that handrails are as long as the stairways.  Check any carpeting to make sure that it is firmly attached to the stairs. Fix any carpet that is loose or worn.  Avoid having throw rugs at the top or bottom of the stairs. If you do have throw rugs, attach them to the floor with carpet tape.  Make sure that you have a light switch at the top of the stairs and the bottom of the stairs. If you do not have them, ask someone to add them for you. What else can I do to help prevent falls?  Wear shoes that:  Do not have high heels.  Have rubber bottoms.  Are comfortable and fit you well.  Are closed at the toe. Do not wear sandals.  If you use a stepladder:  Make sure that it is fully  opened. Do not climb a closed stepladder.  Make sure that both sides of the stepladder are locked into place.  Ask someone to hold it for you, if possible.  Clearly mark and make sure that you can see:  Any grab bars or handrails.  First and last steps.  Where the edge of each step is.  Use tools that help you move around (mobility aids) if they are needed. These include:  Canes.  Walkers.  Scooters.  Crutches.  Turn on the lights when you go into a dark area. Replace any light bulbs as soon as they burn out.  Set up your furniture so you have a clear path. Avoid moving your furniture around.  If any of your floors are uneven, fix them.  If there are any pets around you, be aware of where they are.  Review your medicines with your doctor. Some medicines can make you feel dizzy. This can increase your chance of falling. Ask your doctor what other things that you can do to help prevent falls. This information is not intended to replace advice given to you by your health care provider. Make sure you discuss any questions you have with your health care provider. Document Released: 09/12/2009 Document Revised: 04/23/2016 Document Reviewed: 12/21/2014 Elsevier Interactive Patient Education  2017 [employer].

## 2019-07-10 NOTE — Progress Notes (Signed)
Subjective:   Robert Fuller is a 68 y.o. male who presents for Medicare Annual/Subsequent preventive examination.    This visit is being conducted through telemedicine due to the COVID-19 pandemic. This patient has given me verbal consent via doximity to conduct this visit, patient states they are participating from their home address. Some vital signs may be absent or patient reported.    Patient identification: identified by name, DOB, and current address  Review of Systems:  N/A  Cardiac Risk Factors include: advanced age (>27men, >33 women);male gender     Objective:    Vitals: There were no vitals taken for this visit.  There is no height or weight on file to calculate BMI. Unable to obtain vitals due to visit being conducted via telephonically.   Advanced Directives 07/10/2019 12/29/2017 12/29/2017 05/05/2016 04/30/2016 04/06/2016  Does Patient Have a Medical Advance Directive? Yes No Yes No No No  Type of Paramedic of [place];Living will - [place]  Does patient want to make changes to medical advance directive? - Yes (MAU/Ambulatory/Procedural Areas - Information given) - - - -  Copy of [place] in Chart? No - copy requested - No - copy requested - - -  Would patient like information on creating a medical advance directive? - - - No - patient declined information No - patient declined information No - patient declined information    Tobacco Social History   Tobacco Use  Smoking Status Never Smoker  Smokeless Tobacco Never Used     Counseling given: Not Answered   Clinical Intake:  Pre-visit preparation completed: Yes  Pain Score: 0-No pain     Nutritional Risks: None Diabetes: No  How often do you need to have someone help you when you read instructions, pamphlets, or other written materials from your doctor or pharmacy?: 1 - Never  Interpreter Needed?: No  Information entered by ::  [facility], LPN  Past Medical History:  Diagnosis Date  . Barrett's esophagus   . Cough   . Esophageal reflux   . Kidney stone    Past Surgical History:  Procedure Laterality Date  . ESOPHAGOGASTRODUODENOSCOPY (EGD) WITH PROPOFOL N/A 04/06/2016   Procedure: ESOPHAGOGASTRODUODENOSCOPY (EGD) WITH PROPOFOL;  Surgeon: Hulen Luster, MD;  Location[facility] ENDOSCOPY;  Service: Gastroenterology;  Laterality: N/A;  . NO PAST SURGERIES    . UPPER GI ENDOSCOPY  04/06/16   normal other than esophageal changes present consistent with barretts esophagus, repeat 2019   Family History  Problem Relation Age of Onset  . Lung cancer Mother   . Parkinsonism Father    Social History   Socioeconomic History  . Marital status: Married    Spouse name: Not on file  . Number of children: 2  . Years of education: Not on file  . Highest education level: Bachelor's degree (e.g., BA, AB, BS)  Occupational History  . 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 Financial resource strain: Not hard at all  . Food insecurity    Worry: Never true    Inability: Never true  . Transportation needs    Medical: No    Non-medical: No  Tobacco Use  . Smoking status: Never Smoker  . Smokeless tobacco: Never Used  Substance and Sexual Activity  . Alcohol use: Yes    Alcohol/week: 4.0 - 6.0 standard drinks    Types: 4 - 6 Shots  of liquor per week  . Drug use: No  . Sexual activity: Not on file  Lifestyle  . Physical activity    Days per week: 0 days    Minutes per session: 0 min  . Stress: Not at all  Relationships  . Social Herbalist on phone: Patient refused    Gets together: Patient refused    Attends religious service: Patient refused    Active member of club or organization: Patient refused    Attends meetings of clubs or organizations: Patient refused    Relationship status: Patient refused  Other Topics Concern  . Not on file  Social History Narrative   . Not on file    Outpatient Encounter Medications as of 07/10/2019  Medication Sig  . esomeprazole (NEXIUM) 20 MG capsule Take 20 mg by mouth daily at 12 noon.  . SODIUM FLUORIDE 5000 PPM DT as needed.    No facility-administered encounter medications on file as of 07/10/2019.     Activities of Daily Living In your present state of health, do you have any difficulty performing the following activities: 07/10/2019  Hearing? 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  Vision? N  Comment Wears eye glasses daily.  Difficulty concentrating or making decisions? N  Walking or climbing stairs? N  Dressing or bathing? N  Doing errands, shopping? N  Preparing Food and eating ? N  Using the Toilet? N  In the past six months, have you accidently leaked urine? N  Do you have problems with loss of bowel control? N  Managing your Medications? N  Managing your Finances? N  Housekeeping or managing your Housekeeping? N  Some recent data might be hidden    Patient Care Team: Jerrol Banana., MD as PCP - General (Family Medicine)   Assessment:   This is a routine wellness examination for Trea.  Exercise Activities and Dietary recommendations Current Exercise Habits: The patient does not participate in regular exercise at present, Exercise limited by: None identified  Goals    . Exercise 3x per week (30 min per time)     Recommend starting to exercise 3 days a week for at least 30 minutes.        Fall Risk: Fall Risk  07/10/2019 12/29/2017 08/04/2016  Falls in the past year? 0 No No    FALL RISK PREVENTION PERTAINING TO THE HOME:  Any stairs in or around the home? Yes  If so, are there any without handrails? No   Home free of loose throw rugs in walkways, pet beds, electrical cords, etc? Yes  Adequate lighting in your home to reduce risk of falls? Yes   ASSISTIVE DEVICES UTILIZED TO PREVENT FALLS:  Life alert? No  Use of a cane, walker or w/c? No   Grab bars in the bathroom? Yes  Shower chair or bench in shower? No  Elevated toilet seat or a handicapped toilet? Yes   TIMED UP AND GO:  Was the test performed? No .    Depression Screen PHQ 2/9 Scores 07/10/2019 12/29/2017 12/29/2017 08/04/2016  PHQ - 2 Score 0 0 0 0  PHQ- 9 Score - 0 - -    Cognitive Function     6CIT Screen 07/10/2019  What Year? 0 points  What month? 0 points  What time? 0 points  Count back from 20 0 points  Months in reverse 0 points  Repeat phrase 0 points  Total  Score 0    Immunization History  Administered Date(s) Administered  . Pneumococcal Conjugate-13 12/29/2017  . Rabies, IM 05/05/2016, 05/08/2016, 05/12/2016  . Tdap 07/06/2012  . Zoster 07/06/2012    Qualifies for Shingles Vaccine? Yes  Zostavax completed 07/06/12. Due for Shingrix. Education has been provided regarding the importance of this vaccine. Pt has been advised to call insurance company to determine out of pocket expense. Advised may also receive vaccine at local pharmacy or Health Dept. Verbalized acceptance and understanding.  Tdap: Up to date  Flu Vaccine: Due fall 2020  Pneumococcal Vaccine: Due for Pneumococcal vaccine. Does the patient want to receive this vaccine today?  No .   Screening Tests Health Maintenance  Topic Date Due  . PNA vac Low Risk Adult (2 of 2 - PPSV23) 12/29/2018  . INFLUENZA VACCINE  07/01/2019  . COLONOSCOPY  03/16/2021  . TETANUS/TDAP  07/06/2022  . Hepatitis C Screening  Completed   Cancer Screenings:  Colorectal Screening: Completed 03/16/18. Repeat every 3 years.  Lung Cancer Screening: (Low Dose CT Chest recommended if Age 69-80 years, 30 pack-year currently smoking OR have quit w/in 15years.) does not qualify.   Additional Screening:  Hepatitis C Screening: Up to date  Vision Screening: Recommended annual ophthalmology exams for early detection of glaucoma and other disorders of the eye.  Dental Screening: Recommended annual  dental exams for proper oral hygiene  Community Resource Referral:  CRR required this visit?  No        Plan:  I have personally reviewed and addressed the Medicare Annual Wellness questionnaire and have noted the following in the patient's chart:  A. Medical and social history B. Use of alcohol, tobacco or illicit drugs  C. Current medications and supplements D. Functional ability and status E.  Nutritional status F.  Physical activity G. Advance directives H. List of other physicians I.  Hospitalizations, surgeries, and ER visits in previous 12 months J.  [place] such as hearing and vision if needed, cognitive and depression L. Referrals and appointments   In addition, I have reviewed and discussed with patient certain preventive protocols, quality metrics, and best practice recommendations. A written personalized care plan for preventive services as well as general preventive health recommendations were provided to patient.   [place] Score, [place]  2/83/1517 Nurse Health Advisor   Nurse Notes: Pneumovax 23 due- pt agreed to receive at next in office visit. Pt would like to discuss numbness at OV too.

## 2019-07-18 ENCOUNTER — Ambulatory Visit (INDEPENDENT_AMBULATORY_CARE_PROVIDER_SITE_OTHER): Payer: Medicare HMO | Admitting: Family Medicine

## 2019-07-18 ENCOUNTER — Other Ambulatory Visit: Payer: Self-pay

## 2019-07-18 ENCOUNTER — Encounter: Payer: Self-pay | Admitting: Family Medicine

## 2019-07-18 VITALS — BP 128/78 | HR 60 | Temp 98.9°F | Resp 16 | Ht 74.0 in | Wt 224.0 lb

## 2019-07-18 DIAGNOSIS — K219 Gastro-esophageal reflux disease without esophagitis: Secondary | ICD-10-CM | POA: Diagnosis not present

## 2019-07-18 DIAGNOSIS — Z0000 Encounter for general adult medical examination without abnormal findings: Secondary | ICD-10-CM

## 2019-07-18 DIAGNOSIS — Z23 Encounter for immunization: Secondary | ICD-10-CM

## 2019-07-18 DIAGNOSIS — R05 Cough: Secondary | ICD-10-CM

## 2019-07-18 DIAGNOSIS — G5603 Carpal tunnel syndrome, bilateral upper limbs: Secondary | ICD-10-CM | POA: Diagnosis not present

## 2019-07-18 DIAGNOSIS — R059 Cough, unspecified: Secondary | ICD-10-CM

## 2019-07-18 NOTE — Progress Notes (Signed)
Patient: Robert Fuller, Male    DOB: 07-09-1951, 68 y.o.   MRN: [id number] Visit Date: 07/18/2019  Today's Provider: [name], MD   Chief Complaint  Patient presents with  . Annual Exam  . Numbness    in both hands   Subjective:   Patient had AWV on 07/10/2019   Annual Physical visit Robert Fuller is a 68 y.o. male. He feels well. He reports exercising occasionally. He reports he is sleeping well.  Colonoscopy- 03/16/2018. Normal. Repeat 10 yrs.      Review of Systems  Constitutional: Negative.   Eyes: Negative.   Respiratory:       Snores  Cardiovascular: Negative.   Gastrointestinal: Negative.   Endocrine: Negative.   Genitourinary: Negative.   Musculoskeletal: Negative.   Skin: Negative.   Allergic/Immunologic: Negative.   Neurological: Positive for numbness.       Occasional numbness in hands which only last minutes--from driving or sleeping.  Hematological: Negative.   Psychiatric/Behavioral: Negative.     Social History   Socioeconomic History  . Marital status: Married    Spouse name: Not on file  . Number of children: 2  . Years of education: Not on file  . Highest education level: Bachelor's degree (e.g., BA, AB, BS)  Occupational History  . 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 Financial resource strain: Not hard at all  . Food insecurity    Worry: Never true    Inability: Never true  . Transportation needs    Medical: No    Non-medical: No  Tobacco Use  . Smoking status: Never Smoker  . Smokeless tobacco: Never Used  Substance and Sexual Activity  . Alcohol use: Yes    Alcohol/week: 4.0 - 6.0 standard drinks    Types: 4 - 6 Shots of liquor per week  . Drug use: No  . Sexual activity: Not on file  Lifestyle  . Physical activity    Days per week: 0 days    Minutes per session: 0 min  . Stress: Not at all  Relationships  . Social Herbalist on phone: Patient refused     Gets together: Patient refused    Attends religious service: Patient refused    Active member of club or organization: Patient refused    Attends meetings of clubs or organizations: Patient refused    Relationship status: Patient refused  . Intimate partner violence    Fear of current or ex partner: Patient refused    Emotionally abused: Patient refused    Physically abused: Patient refused    Forced sexual activity: Patient refused  Other Topics Concern  . Not on file  Social History Narrative  . Not on file    Past Medical History:  Diagnosis Date  . Barrett's esophagus   . Cough   . Esophageal reflux   . Kidney stone      Patient Active Problem List   Diagnosis Date Noted  . 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 G E R D 02/05/2011  . BARRETTS ESOPHAGUS 02/05/2011  . COUGH 02/05/2011  . Avitaminosis D 03/01/2009  . 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 ESOPHAGOGASTRODUODENOSCOPY (EGD) WITH PROPOFOL N/A 04/06/2016   Procedure: ESOPHAGOGASTRODUODENOSCOPY (EGD) WITH PROPOFOL;  Surgeon: Hulen Luster,  MD;  Location: [facility] ENDOSCOPY;  Service: Gastroenterology;  Laterality: N/A;  . NO PAST SURGERIES    . UPPER GI ENDOSCOPY  04/06/16   normal other than esophageal changes present consistent with barretts esophagus, repeat 2019    His family history includes Lung cancer in his mother; Parkinsonism in his father.   Current Outpatient Medications:  .  esomeprazole (NEXIUM) 20 MG capsule, Take 20 mg by mouth daily at 12 noon., Disp: , Rfl:  .  SODIUM FLUORIDE 5000 PPM DT, as needed. , Disp: , Rfl:   Patient Care Team: Jerrol Banana., MD as PCP - General (Family Medicine)    Objective:    Vitals: BP 128/78   Pulse 60   Temp 98.9 F (37.2 C)   Resp 16   Ht 6\' 2"  (1.88 m)   Wt 224 lb (101.6 kg)   SpO2 98%   BMI 28.76 kg/m    Physical Exam  Activities of Daily Living In your present state of health, do you have any difficulty performing the following activities: 07/10/2019  Hearing? 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  Vision? N  Comment Wears eye glasses daily.  Difficulty concentrating or making decisions? N  Walking or climbing stairs? N  Dressing or bathing? N  Doing errands, shopping? N  Preparing Food and eating ? N  Using the Toilet? N  In the past six months, have you accidently leaked urine? N  Do you have problems with loss of bowel control? N  Managing your Medications? N  Managing your Finances? N  Housekeeping or managing your Housekeeping? N  Some recent data might be hidden    Fall Risk Assessment Fall Risk  07/10/2019 12/29/2017 08/04/2016  Falls in the past year? 0 No No     Depression Screen PHQ 2/9 Scores 07/10/2019 12/29/2017 12/29/2017 08/04/2016  PHQ - 2 Score 0 0 0 0  PHQ- 9 Score - 0 - -    6CIT Screen 07/10/2019  What Year? 0 points  What month? 0 points  What time? 0 points  Count back from 20 0 points  Months in reverse 0 points  Repeat phrase 0 points  Total Score 0      Assessment & Plan:     Annual Wellness Visit  Reviewed patient's Family Medical History Reviewed and updated list of patient's medical providers Assessment of cognitive impairment was done Assessed patient's functional ability Established a written schedule for health screening [place] Completed and Reviewed  Exercise Activities and Dietary recommendations Goals    . Exercise 3x per week (30 min per time)     Recommend starting to exercise 3 days a week for at least 30 minutes.        Immunization History  Administered Date(s) Administered  . Pneumococcal Conjugate-13 12/29/2017  . Rabies, IM 05/05/2016, 05/08/2016, 05/12/2016  . Tdap 07/06/2012  . Zoster 07/06/2012    Health Maintenance  Topic Date Due  . PNA vac Low Risk  Adult (2 of 2 - PPSV23) 12/29/2018  . INFLUENZA VACCINE  07/01/2019  . COLONOSCOPY  03/16/2021  . TETANUS/TDAP  07/06/2022  . Hepatitis C Screening  Completed     Discussed health benefits of physical activity, and encouraged him to engage in regular exercise appropriate for his age and conditio 1. Annual physical exam  - CBC w/Diff/Platelet - PSA - TSH - Comp. Metabolic Panel (12)  2. Gastroesophageal reflux disease, esophagitis presence  not specified  - Comp. Metabolic Panel (12)  3. Cough  - CBC w/Diff/Platelet  4. Bilateral carpal tunnel syndrome Wear cock up wrist splints.    I, Manning Presley, CMA, am acting as a Education administrator for [employer]. Rosanna Randy, [place]    [name], MD  [facility]

## 2019-07-18 NOTE — Patient Instructions (Signed)
1. Wear cock up wrist splints QHS 2. For head itching use tar shampoo or Selsun Blue 3.Use Gaviscon for heartburn

## 2019-07-25 DIAGNOSIS — R05 Cough: Secondary | ICD-10-CM | POA: Diagnosis not present

## 2019-07-25 DIAGNOSIS — Z0000 Encounter for general adult medical examination without abnormal findings: Secondary | ICD-10-CM | POA: Diagnosis not present

## 2019-07-25 DIAGNOSIS — K219 Gastro-esophageal reflux disease without esophagitis: Secondary | ICD-10-CM | POA: Diagnosis not present

## 2019-07-26 LAB — COMP. METABOLIC PANEL (12)
AST: 22 IU/L (ref 0–40)
Albumin/Globulin Ratio: 2 (ref 1.2–2.2)
Albumin: 4.3 g/dL (ref 3.8–4.8)
Alkaline Phosphatase: 67 IU/L (ref 39–117)
BUN/Creatinine Ratio: 13 (ref 10–24)
BUN: 14 mg/dL (ref 8–27)
Bilirubin Total: 1.3 mg/dL — ABNORMAL HIGH (ref 0.0–1.2)
Calcium: 9.2 mg/dL (ref 8.6–10.2)
Chloride: 105 mmol/L (ref 96–106)
Creatinine, Ser: 1.08 mg/dL (ref 0.76–1.27)
GFR calc Af Amer: 81 mL/min/{1.73_m2} (ref >59)
GFR calc non Af Amer: 70 mL/min/{1.73_m2} (ref >59)
Globulin, Total: 2.1 g/dL (ref 1.5–4.5)
Glucose: 118 mg/dL — ABNORMAL HIGH (ref 65–99)
Potassium: 4.2 mmol/L (ref 3.5–5.2)
Sodium: 140 mmol/L (ref 134–144)
Total Protein: 6.4 g/dL (ref 6.0–8.5)

## 2019-07-26 LAB — CBC WITH DIFFERENTIAL/PLATELET
Basophils Absolute: 0.1 10*3/uL (ref 0.0–0.2)
Basos: 2 %
EOS (ABSOLUTE): 0.1 10*3/uL (ref 0.0–0.4)
Eos: 3 %
Hematocrit: 45.6 % (ref 37.5–51.0)
Hemoglobin: 15.8 g/dL (ref 13.0–17.7)
Immature Grans (Abs): 0 10*3/uL (ref 0.0–0.1)
Immature Granulocytes: 1 %
Lymphocytes Absolute: 1.3 10*3/uL (ref 0.7–3.1)
Lymphs: 37 %
MCH: 31.9 pg (ref 26.6–33.0)
MCHC: 34.6 g/dL (ref 31.5–35.7)
MCV: 92 fL (ref 79–97)
Monocytes Absolute: 0.4 10*3/uL (ref 0.1–0.9)
Monocytes: 11 %
Neutrophils Absolute: 1.6 10*3/uL (ref 1.4–7.0)
Neutrophils: 46 %
Platelets: 181 10*3/uL (ref 150–450)
RBC: 4.96 x10E6/uL (ref 4.14–5.80)
RDW: 13.4 % (ref 11.6–15.4)
WBC: 3.5 10*3/uL (ref 3.4–10.8)

## 2019-07-26 LAB — PSA: Prostate Specific Ag, Serum: 0.6 ng/mL (ref 0.0–4.0)

## 2019-07-26 LAB — TSH: TSH: 2.01 u[IU]/mL (ref 0.450–4.500)

## 2019-07-27 ENCOUNTER — Telehealth: Payer: Self-pay

## 2019-07-27 DIAGNOSIS — R7303 Prediabetes: Secondary | ICD-10-CM

## 2019-07-27 NOTE — Telephone Encounter (Signed)
Patient notified of lab results. He wanted to know if you think it would be a good idea for him to see a dietician, and if so can he be referred? Please advise

## 2019-07-27 NOTE — Telephone Encounter (Signed)
-----   Message from Jerrol Banana., MD sent at 07/26/2019  4:14 PM EDT ----- Labs ok--prediabetic--work on diet and exercise.Maybe f/u 3-6 months for A1C might be good.

## 2019-07-28 NOTE — Telephone Encounter (Signed)
Order for CCM placed.

## 2019-07-28 NOTE — Telephone Encounter (Signed)
We can refer to Lifestyle/dietician or to CCM.

## 2019-08-08 ENCOUNTER — Telehealth: Payer: Self-pay

## 2019-08-10 ENCOUNTER — Telehealth: Payer: Self-pay

## 2019-08-28 NOTE — Addendum Note (Signed)
Addended by: Wilder Glade on: 08/28/2019 09:32 AM   Modules accepted: Orders

## 2019-09-12 ENCOUNTER — Telehealth: Payer: Self-pay

## 2019-09-12 DIAGNOSIS — M62830 Muscle spasm of back: Secondary | ICD-10-CM | POA: Diagnosis not present

## 2019-09-12 DIAGNOSIS — M9903 Segmental and somatic dysfunction of lumbar region: Secondary | ICD-10-CM | POA: Diagnosis not present

## 2019-09-12 DIAGNOSIS — M545 Low back pain: Secondary | ICD-10-CM | POA: Diagnosis not present

## 2019-09-12 DIAGNOSIS — M7918 Myalgia, other site: Secondary | ICD-10-CM | POA: Diagnosis not present

## 2019-09-12 DIAGNOSIS — M9904 Segmental and somatic dysfunction of sacral region: Secondary | ICD-10-CM | POA: Diagnosis not present

## 2019-09-13 DIAGNOSIS — M9903 Segmental and somatic dysfunction of lumbar region: Secondary | ICD-10-CM | POA: Diagnosis not present

## 2019-09-13 DIAGNOSIS — M62830 Muscle spasm of back: Secondary | ICD-10-CM | POA: Diagnosis not present

## 2019-09-13 DIAGNOSIS — M9904 Segmental and somatic dysfunction of sacral region: Secondary | ICD-10-CM | POA: Diagnosis not present

## 2019-09-13 DIAGNOSIS — M545 Low back pain: Secondary | ICD-10-CM | POA: Diagnosis not present

## 2019-09-13 DIAGNOSIS — M7918 Myalgia, other site: Secondary | ICD-10-CM | POA: Diagnosis not present

## 2019-09-14 ENCOUNTER — Other Ambulatory Visit: Payer: Self-pay

## 2019-09-14 ENCOUNTER — Ambulatory Visit: Payer: Self-pay | Admitting: *Deleted

## 2019-09-14 DIAGNOSIS — M7918 Myalgia, other site: Secondary | ICD-10-CM | POA: Diagnosis not present

## 2019-09-14 DIAGNOSIS — M9903 Segmental and somatic dysfunction of lumbar region: Secondary | ICD-10-CM | POA: Diagnosis not present

## 2019-09-14 DIAGNOSIS — M62830 Muscle spasm of back: Secondary | ICD-10-CM | POA: Diagnosis not present

## 2019-09-14 DIAGNOSIS — M9904 Segmental and somatic dysfunction of sacral region: Secondary | ICD-10-CM | POA: Diagnosis not present

## 2019-09-14 DIAGNOSIS — M545 Low back pain: Secondary | ICD-10-CM | POA: Diagnosis not present

## 2019-09-14 DIAGNOSIS — R7303 Prediabetes: Secondary | ICD-10-CM

## 2019-09-14 NOTE — Patient Instructions (Signed)
Visit Information  Goals Addressed            This Visit's Progress   . "I'd like to learn about what to eat so I can keep from getting diabetes" (pt-stated)       Current Barriers:  Marland Kitchen Knowledge Deficits related to blood glucose control strategies  Nurse Case Manager Clinical Goal(s):  Marland Kitchen Over the next 30 days, patient will verbalize understanding of plan for strategies to self manage glucose and monitor appropriately for signs/symptoms of DM  Interventions:  . Evaluation of current treatment plan related to glucose management and patient's adherence to plan as established by provider. . Advised patient to anticipate educational info in the mail . Provided education to patient re: carb modified diet, signs and symptoms of hypo and hyperglycemia, strategies to promote optimal glucose control  Patient Self Care Activities:  . Self administers medications as prescribed . Attends all scheduled provider appointments . Calls pharmacy for medication refills . Attends church or other social activities . Performs ADL's independently . Performs IADL's independently . Calls provider office for new concerns or questions  Initial goal documentation        Robert Fuller was given information about Care Management services today including:  1. Care Management services include personalized support from designated clinical staff supervised by his physician, including individualized plan of care and coordination with other care providers 2. 24/7 contact phone numbers for assistance for urgent and routine care needs. 3. The patient may stop CCM services at any time (effective at the end of the month) by phone call to the office staff.  Patient agreed to services and verbal consent obtained.   The patient verbalized understanding of instructions provided today and declined a print copy of patient instruction materials.   The care management team will reach out to the patient again over the next 30  days.   [facility] Care Management [phone]

## 2019-09-14 NOTE — Chronic Care Management (AMB) (Signed)
  Care Management   Initial Visit Note  09/14/2019 Name: Robert Fuller MRN: [id number] DOB: 08-Jun-1951  Subjective: "I want to learn how to keep my blood sugar under control"   Objective:  Lab Results  Component Value Date   HGBA1C 5.9 (H) 12/31/2017   HGBA1C 5.8 (H) 08/04/2016   Lab Results  Component Value Date   LDLCALC 78 12/31/2017   CREATININE 1.08 07/25/2019     Assessment: Robert Fuller is a 68 y.o. year old male who sees Jerrol Banana., MD for primary care. The care management team was consulted for assistance with care management and care coordination needs related to Educational Needs related to blood glucose management.   Review of patient status, including review of consultants reports, relevant laboratory and other test results, and collaboration with appropriate care team members and the patient's provider was performed as part of comprehensive patient evaluation and provision of care management services.    SDOH (Social Determinants of Health) screening performed today: None. See Care Plan for related entries.   Advanced Directives: N See Care Plan and Vynca application for related entries.   Outpatient Encounter Medications as of 09/14/2019  Medication Sig  . esomeprazole (NEXIUM) 20 MG capsule Take 20 mg by mouth daily at 12 noon.  . SODIUM FLUORIDE 5000 PPM DT as needed.    No facility-administered encounter medications on file as of 09/14/2019.     Goals Addressed            This Visit's Progress   . "I'd like to learn about what to eat so I can keep from getting diabetes" (pt-stated)       Current Barriers:  Marland Kitchen Knowledge Deficits related to blood glucose control strategies  Nurse Case Manager Clinical Goal(s):  Marland Kitchen Over the next 30 days, patient will verbalize understanding of plan for strategies to self manage glucose and monitor appropriately for signs/symptoms of DM  Interventions:  . Evaluation of current treatment plan related to glucose  management and patient's adherence to plan as established by provider. . Advised patient to anticipate educational info in the mail . Provided education to patient re: carb modified diet, signs and symptoms of hypo and hyperglycemia, strategies to promote optimal glucose control  Patient Self Care Activities:  . Self administers medications as prescribed . Attends all scheduled provider appointments . Calls pharmacy for medication refills . Attends church or other social activities . Performs ADL's independently . Performs IADL's independently . Calls provider office for new concerns or questions  Initial goal documentation         Follow up plan:  The care management team will reach out to the patient again over the next 30 days.   [name] was given information about Care Management services today including:  1. Care Management services include personalized support from designated clinical staff supervised by a physician, including individualized plan of care and coordination with other care providers 2. 24/7 contact phone numbers for assistance for urgent and routine care needs. 3. The patient may stop Care Management services at any time (effective at the end of the month) by phone call to the office staff.  Patient agreed to services and verbal consent obtained.  [facility] Care Management [phone]

## 2019-09-15 DIAGNOSIS — M62830 Muscle spasm of back: Secondary | ICD-10-CM | POA: Diagnosis not present

## 2019-09-15 DIAGNOSIS — M9904 Segmental and somatic dysfunction of sacral region: Secondary | ICD-10-CM | POA: Diagnosis not present

## 2019-09-15 DIAGNOSIS — M545 Low back pain: Secondary | ICD-10-CM | POA: Diagnosis not present

## 2019-09-15 DIAGNOSIS — M9903 Segmental and somatic dysfunction of lumbar region: Secondary | ICD-10-CM | POA: Diagnosis not present

## 2019-09-15 DIAGNOSIS — M7918 Myalgia, other site: Secondary | ICD-10-CM | POA: Diagnosis not present

## 2019-10-23 DIAGNOSIS — K219 Gastro-esophageal reflux disease without esophagitis: Secondary | ICD-10-CM | POA: Diagnosis not present

## 2019-10-23 DIAGNOSIS — Z809 Family history of malignant neoplasm, unspecified: Secondary | ICD-10-CM | POA: Diagnosis not present

## 2019-10-23 DIAGNOSIS — Z791 Long term (current) use of non-steroidal anti-inflammatories (NSAID): Secondary | ICD-10-CM | POA: Diagnosis not present

## 2019-10-23 DIAGNOSIS — G8929 Other chronic pain: Secondary | ICD-10-CM | POA: Diagnosis not present

## 2019-10-23 DIAGNOSIS — Z87891 Personal history of nicotine dependence: Secondary | ICD-10-CM | POA: Diagnosis not present

## 2019-10-23 DIAGNOSIS — Z882 Allergy status to sulfonamides status: Secondary | ICD-10-CM | POA: Diagnosis not present

## 2019-10-23 DIAGNOSIS — Z881 Allergy status to other antibiotic agents status: Secondary | ICD-10-CM | POA: Diagnosis not present

## 2019-10-23 DIAGNOSIS — Z833 Family history of diabetes mellitus: Secondary | ICD-10-CM | POA: Diagnosis not present

## 2019-11-30 DIAGNOSIS — H2513 Age-related nuclear cataract, bilateral: Secondary | ICD-10-CM | POA: Diagnosis not present

## 2019-12-28 DIAGNOSIS — R140 Abdominal distension (gaseous): Secondary | ICD-10-CM | POA: Diagnosis not present

## 2019-12-28 DIAGNOSIS — R1013 Epigastric pain: Secondary | ICD-10-CM | POA: Diagnosis not present

## 2020-01-10 DIAGNOSIS — R69 Illness, unspecified: Secondary | ICD-10-CM | POA: Diagnosis not present

## 2020-01-16 NOTE — Progress Notes (Signed)
Patient: Robert Fuller Male    DOB: 09/11/1951   69 y.o.   MRN: [id number] Visit Date: 01/17/2020  Today's Provider: [name], MD   Chief Complaint  Patient presents with  . Hyperglycemia  . Gastroesophageal Reflux   Subjective:     HPI  Patient and his brother are now retired from [employer].  He is doing well.  Had his first Covid shot.  Was prediabetic on his last lab draw. Hyperglycemia From 07/18/2019-labs checked showing-Labs ok--prediabetic. Advised to work on diet and exercise. Maybe follow up 3-6 months for A1C might be good. Last HgbA1C 12/31/17 was 5.9%, patient reports that he has been working on lifstyle changes and improving his diet.  Lab Results  Component Value Date   HGBA1C 5.6 01/17/2020    GERD From 07/18/2019-labs checked showing-Labs ok.   GERD, Follow up:  The patient was last seen for GERD 6 months ago. Changes made since that visit include none, patient states that since he saw G.I he has stopped glucosamine due to irritation of the lining of the stomach and states that he is taking Newxium and Tagamet and has been doing well.  He reports excellent compliance with treatment. He is not having side effects. Marland Kitchen  He IS experiencing belching. He is NOT experiencing abdominal bloating, deep pressure at base of neck, difficulty swallowing, dysphagia, fullness after meals, heartburn, midespigastric pain, nausea, need to clear throat frequently, regurgitation of undigested food, shortness of breath or symptoms primarily relate to meals, and lying down after meals  ------------------------------------------------------------------------    Allergies  Allergen Reactions  . Cefprozil   . Ketorolac Tromethamine   . Morphine   . Sulfa Antibiotics      Current Outpatient Medications:  .  esomeprazole (NEXIUM) 20 MG capsule, Take 20 mg by mouth daily at 12 noon., Disp: , Rfl:  .  SODIUM FLUORIDE 5000 PPM DT, as needed. , Disp: , Rfl:    Review of Systems  Constitutional: Negative for appetite change, chills and fever.  HENT: Negative.   Eyes: Negative.   Respiratory: Negative for chest tightness, shortness of breath and wheezing.   Cardiovascular: Negative for chest pain and palpitations.  Gastrointestinal: Negative for abdominal pain, nausea and vomiting.  Endocrine: Negative.   Musculoskeletal: Negative.   Allergic/Immunologic: Negative.   Neurological: Negative.   Hematological: Negative.   Psychiatric/Behavioral: Negative.     Social History   Tobacco Use  . Smoking status: Never Smoker  . Smokeless tobacco: Never Used  Substance Use Topics  . Alcohol use: Yes    Alcohol/week: 4.0 - 6.0 standard drinks    Types: 4 - 6 Shots of liquor per week      Objective:   BP 128/76   Pulse 65   Temp 98.2 F (36.8 C) (Oral)   Resp 16   Wt 228 lb (103.4 kg)   SpO2 97%   BMI 29.27 kg/m  Vitals:   01/17/20 0940  BP: 128/76  Pulse: 65  Resp: 16  Temp: 98.2 F (36.8 C)  TempSrc: Oral  SpO2: 97%  Weight: 228 lb (103.4 kg)  Body mass index is 29.27 kg/m.   Physical Exam Vitals reviewed.  Constitutional:      Appearance: Normal appearance.  HENT:     Head: Normocephalic and atraumatic.     Right Ear: External ear normal.     Left Ear: External ear normal.  Eyes:     General: No scleral  icterus.    Conjunctiva/sclera: Conjunctivae normal.  Cardiovascular:     Rate and Rhythm: Normal rate and regular rhythm.     Heart sounds: Normal heart sounds.  Pulmonary:     Breath sounds: Normal breath sounds.  Abdominal:     Palpations: Abdomen is soft.  Musculoskeletal:     Right lower leg: No edema.     Left lower leg: No edema.  Skin:    General: Skin is warm and dry.  Neurological:     General: No focal deficit present.     Mental Status: He is alert and oriented to person, place, and time.  Psychiatric:        Mood and Affect: Mood normal.        Behavior: Behavior normal.        Thought  Content: Thought content normal.        Judgment: Judgment normal.      Results for orders placed or performed in visit on 01/17/20  POCT glycosylated hemoglobin (Hb A1C)  Result Value Ref Range   Hemoglobin A1C 5.6 4.0 - 5.6 %   HbA1c POC (<> result, manual entry)     HbA1c, POC (prediabetic range)     HbA1c, POC (controlled diabetic range)         Assessment & Plan    1. Hyperglycemia A1c today is 5.6.  Good control of sugar.  Follow-up 6 months. - POCT glycosylated hemoglobin (Hb A1C)  2. 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

## 2020-01-17 ENCOUNTER — Encounter: Payer: Self-pay | Admitting: Family Medicine

## 2020-01-17 ENCOUNTER — Other Ambulatory Visit: Payer: Self-pay

## 2020-01-17 ENCOUNTER — Ambulatory Visit (INDEPENDENT_AMBULATORY_CARE_PROVIDER_SITE_OTHER): Payer: Medicare HMO | Admitting: Family Medicine

## 2020-01-17 VITALS — BP 128/76 | HR 65 | Temp 98.2°F | Resp 16 | Wt 228.0 lb

## 2020-01-17 DIAGNOSIS — R739 Hyperglycemia, unspecified: Secondary | ICD-10-CM | POA: Diagnosis not present

## 2020-01-17 DIAGNOSIS — N529 Male erectile dysfunction, unspecified: Secondary | ICD-10-CM | POA: Diagnosis not present

## 2020-01-17 DIAGNOSIS — E559 Vitamin D deficiency, unspecified: Secondary | ICD-10-CM | POA: Diagnosis not present

## 2020-01-17 LAB — POCT GLYCOSYLATED HEMOGLOBIN (HGB A1C): Hemoglobin A1C: 5.6 % (ref 4.0–5.6)

## 2020-01-23 ENCOUNTER — Ambulatory Visit: Payer: Self-pay

## 2020-01-23 DIAGNOSIS — R7303 Prediabetes: Secondary | ICD-10-CM

## 2020-01-23 DIAGNOSIS — K2100 Gastro-esophageal reflux disease with esophagitis, without bleeding: Secondary | ICD-10-CM

## 2020-01-23 NOTE — Chronic Care Management (AMB) (Signed)
  Care Management   Follow Up Note   01/23/2020 Name: PATE NOBLE MRN: [id number] DOB: 1951/11/09  Primary Care Provider: Jerrol Banana., MD Reason for referral : Chronic Care Management   AKASHDEEP TRIMNAL is a 69 y.o. year old male who is a primary care patient of Jerrol Banana., MD. The care management team was consulted for assistance with care management and care coordination needs.  A routine telephonic outreach was conducted today.  Mr. Brogden reports receiving the initial Covid-19 vaccination dose. He anticipates receiving the second dose on 01/31/20.  Review of Mr. Yepes status, including review of consultants reports, relevant labs and test results was conducted today. Collaboration with appropriate care team members was performed as part of the comprehensive evaluation and provision of chronic care management services.    Outpatient Encounter Medications as of 01/23/2020  Medication Sig  . esomeprazole (NEXIUM) 20 MG capsule Take 20 mg by mouth daily at 12 noon.  . SODIUM FLUORIDE 5000 PPM DT as needed.    No facility-administered encounter medications on file as of 01/23/2020.    BP Readings from Last 3 Encounters:  01/17/20 128/76  07/18/19 128/78  08/29/18 130/80   Lab Results  Component Value Date   HGBA1C 5.6 01/17/2020     Goals Addressed            This Visit's Progress   . Care Management       Current Barriers:  . Care Coordination needs related to prediabetes.  (Hx of GERD)  Case Manager Clinical Goal(s):  Marland Kitchen Over the next 90 days, patient will continue to adhere to providers diet and treatment recommendations r/t prediabetes. . Over the next 90 days, patient will take all medications as prescribed. . Over the next 90 days, patient will attend all scheduled medical appointments.  Interventions:  . Reviewed medications. Encouraged to take medications as prescribed. Encouraged to notify care management team with concerns regarding  prescription costs. . Discussed compliance with provider's recommendations and nutritional intake.  . Reviewed medical appointments. Encouraged to attend appointments as scheduled to prevent delays in care. Denies concerns regarding transportation. Reports pending appointment with Dr. Alice Reichert (Gastroenterology) on 02/08/20.   Patient Self Care Activities:  . Self administers medications  . Attends scheduled provider appointments . Calls pharmacy for medication refills . Attends church or other social activities . Performs ADL's independently . Performs IADL's independently . Calls provider office for new concerns or questions  Please see past updates related to this goal by clicking on the "Past Updates" button in the selected goal         PLAN The care management team will follow up with Mr. Keef next month.     [facility] Care Management [phone]

## 2020-02-08 DIAGNOSIS — R1013 Epigastric pain: Secondary | ICD-10-CM | POA: Diagnosis not present

## 2020-02-08 DIAGNOSIS — K2270 Barrett's esophagus without dysplasia: Secondary | ICD-10-CM | POA: Diagnosis not present

## 2020-02-08 DIAGNOSIS — D369 Benign neoplasm, unspecified site: Secondary | ICD-10-CM | POA: Diagnosis not present

## 2020-02-13 ENCOUNTER — Ambulatory Visit: Payer: Self-pay

## 2020-03-25 NOTE — Patient Instructions (Addendum)
Thank you for allowing the Chronic Care Management team to participate in your care.  Goals Addressed            This Visit's Progress   . Care Management       Current Barriers:  . Care Coordination needs related to prediabetes.  (Hx of GERD)  Case Manager Clinical Goal(s):  Marland Kitchen Over the next 90 days, patient will continue to adhere to providers diet and treatment recommendations r/t prediabetes. . Over the next 90 days, patient will take all medications as prescribed. . Over the next 90 days, patient will attend all scheduled medical appointments.  Interventions:  . Reviewed medications. Encouraged to take medications as prescribed. Encouraged to notify care management team with concerns regarding prescription costs. . Discussed compliance with provider's recommendations and nutritional intake.  . Reviewed medical appointments. Encouraged to attend appointments as scheduled to prevent delays in care. Denies concerns regarding transportation. Reports pending appointment with Dr. Alice Reichert (Gastroenterology) on 02/08/20.   Patient Self Care Activities:  . Self administers medications  . Attends scheduled provider appointments . Calls pharmacy for medication refills . Attends church or other social activities . Performs ADL's independently . Performs IADL's independently . Calls provider office for new concerns or questions  Please see past updates related to this goal by clicking on the "Past Updates" button in the selected goal         Mr. Veillette verbalized understanding of the instructions discussed during the telephonic outreach today. Declined need for a mailed/printed copy of the instructions.   The care management team will follow up with Mr. Obermann next month.   [facility] Care Management [phone]

## 2020-03-25 NOTE — Chronic Care Management (AMB) (Signed)
  Care Management     Name: Robert Fuller MRN: [id number] DOB: Apr 11, 1951   Brief outreach with Robert Fuller. Denies urgent concerns or changes since last conversation. Agreeable to outreach and follow-up in three months.   PLAN The care management team will follow up with Robert Fuller for updates within the next three months. He was encouraged to call with any health related concerns if needed prior to the next outreach.   [facility] Care Management [phone]

## 2020-04-12 DIAGNOSIS — D2271 Melanocytic nevi of right lower limb, including hip: Secondary | ICD-10-CM | POA: Diagnosis not present

## 2020-04-12 DIAGNOSIS — D225 Melanocytic nevi of trunk: Secondary | ICD-10-CM | POA: Diagnosis not present

## 2020-04-12 DIAGNOSIS — L821 Other seborrheic keratosis: Secondary | ICD-10-CM | POA: Diagnosis not present

## 2020-04-12 DIAGNOSIS — D2261 Melanocytic nevi of right upper limb, including shoulder: Secondary | ICD-10-CM | POA: Diagnosis not present

## 2020-04-12 DIAGNOSIS — D2262 Melanocytic nevi of left upper limb, including shoulder: Secondary | ICD-10-CM | POA: Diagnosis not present

## 2020-04-12 DIAGNOSIS — D2272 Melanocytic nevi of left lower limb, including hip: Secondary | ICD-10-CM | POA: Diagnosis not present

## 2020-05-09 ENCOUNTER — Ambulatory Visit: Payer: Self-pay

## 2020-05-09 NOTE — Chronic Care Management (AMB) (Signed)
  Chronic Care Management   Outreach Note  05/09/2020 Name: Robert Fuller MRN: [id number] DOB: [birth year]   Primary Care Provider: Jerrol Banana., MD Reason for referral : Chronic Care Management   [name] was previously engaged with the chronic care management team and referred for assistance with chronic care management and care coordination.  Attempted a routine three month telephonic outreach today. He reports already informing a staff member that he does not require additional outreach.  Conversation ended   PLAN -Will discontinue outreach per patient request. -Will update enrollment status.    [facility] Care Management [phone]

## 2020-05-30 DIAGNOSIS — H903 Sensorineural hearing loss, bilateral: Secondary | ICD-10-CM | POA: Diagnosis not present

## 2020-06-25 DIAGNOSIS — E559 Vitamin D deficiency, unspecified: Secondary | ICD-10-CM | POA: Diagnosis not present

## 2020-06-25 DIAGNOSIS — R200 Anesthesia of skin: Secondary | ICD-10-CM | POA: Diagnosis not present

## 2020-06-25 DIAGNOSIS — R7303 Prediabetes: Secondary | ICD-10-CM | POA: Diagnosis not present

## 2020-06-25 DIAGNOSIS — H02401 Unspecified ptosis of right eyelid: Secondary | ICD-10-CM | POA: Diagnosis not present

## 2020-06-25 DIAGNOSIS — R202 Paresthesia of skin: Secondary | ICD-10-CM | POA: Diagnosis not present

## 2020-06-25 DIAGNOSIS — E519 Thiamine deficiency, unspecified: Secondary | ICD-10-CM | POA: Diagnosis not present

## 2020-06-25 DIAGNOSIS — E538 Deficiency of other specified B group vitamins: Secondary | ICD-10-CM | POA: Diagnosis not present

## 2020-06-25 DIAGNOSIS — M25511 Pain in right shoulder: Secondary | ICD-10-CM | POA: Diagnosis not present

## 2020-06-25 DIAGNOSIS — E531 Pyridoxine deficiency: Secondary | ICD-10-CM | POA: Diagnosis not present

## 2020-06-25 DIAGNOSIS — H9193 Unspecified hearing loss, bilateral: Secondary | ICD-10-CM | POA: Diagnosis not present

## 2020-06-25 DIAGNOSIS — R292 Abnormal reflex: Secondary | ICD-10-CM | POA: Diagnosis not present

## 2020-07-04 DIAGNOSIS — M7541 Impingement syndrome of right shoulder: Secondary | ICD-10-CM | POA: Diagnosis not present

## 2020-07-04 DIAGNOSIS — M25512 Pain in left shoulder: Secondary | ICD-10-CM | POA: Diagnosis not present

## 2020-07-04 DIAGNOSIS — G8929 Other chronic pain: Secondary | ICD-10-CM | POA: Diagnosis not present

## 2020-07-04 DIAGNOSIS — M25511 Pain in right shoulder: Secondary | ICD-10-CM | POA: Diagnosis not present

## 2020-07-04 DIAGNOSIS — M7542 Impingement syndrome of left shoulder: Secondary | ICD-10-CM | POA: Diagnosis not present

## 2020-07-04 DIAGNOSIS — M778 Other enthesopathies, not elsewhere classified: Secondary | ICD-10-CM | POA: Diagnosis not present

## 2020-07-09 NOTE — Progress Notes (Signed)
Subjective:   Robert Fuller is a 69 y.o. male who presents for Medicare Annual/Subsequent preventive examination.  I connected with [name] today by telephone and verified that I am speaking with the correct person using two identifiers. Location patient: home Location provider: work Persons participating in the virtual visit: patient, provider.   I discussed the limitations, risks, security and privacy concerns of performing an evaluation and management service by telephone and the availability of in person appointments. I also discussed with the patient that there may be a patient responsible charge related to this service. The patient expressed understanding and verbally consented to this telephonic visit.    Interactive audio and video telecommunications were attempted between this provider and patient, however failed, due to patient having technical difficulties OR patient did not have access to video capability.  We continued and completed visit with audio only.   Review of Systems    N/A  Cardiac Risk Factors include: advanced age (>13men, >74 women);male gender     Objective:    There were no vitals filed for this visit. There is no height or weight on file to calculate BMI.  Advanced Directives 07/10/2020 07/10/2019 12/29/2017 12/29/2017 05/05/2016 04/30/2016 04/06/2016  Does Patient Have a Medical Advance Directive? Yes Yes No Yes No No No  Type of Paramedic of [place];Living will [place];Living will - [place]  Does patient want to make changes to medical advance directive? - - Yes (MAU/Ambulatory/Procedural Areas - Information given) - - - -  Copy of [place] in Chart? (No Data) No - copy requested - No - copy requested - - -  Would patient like information on creating a medical advance directive? - - - - No - patient declined information No - patient declined information No - patient  declined information    Current Medications (verified) Outpatient Encounter Medications as of 07/10/2020  Medication Sig  . esomeprazole (NEXIUM) 20 MG capsule Take 20 mg by mouth daily at 12 noon.  . SODIUM FLUORIDE 5000 PPM DT as needed.    No facility-administered encounter medications on file as of 07/10/2020.    Allergies (verified) Cefprozil, Ketorolac tromethamine, Morphine, and Sulfa antibiotics   History: Past Medical History:  Diagnosis Date  . Barrett's esophagus   . Cough   . Esophageal reflux   . Kidney stone    Past Surgical History:  Procedure Laterality Date  . ESOPHAGOGASTRODUODENOSCOPY (EGD) WITH PROPOFOL N/A 04/06/2016   Procedure: ESOPHAGOGASTRODUODENOSCOPY (EGD) WITH PROPOFOL;  Surgeon: Hulen Luster, MD;  [facility] END[facility]COPY;  Service: Gastroenterology;  Laterality: N/A;  . NO PAST SURGERIES    . UPPER GI ENDOSCOPY  04/06/16   normal other than esophageal changes present consistent with barretts esophagus, repeat 2019   Family History  Problem Relation Age of Onset  . Lung cancer Mother   . Parkinsonism Father    Social History   Socioeconomic History  . Marital status: Married    Spouse name: Not on file  . Number of children: 2  . Years of education: Not on file  . Highest education level: Bachelor's degree (e.g., BA, AB, BS)  Occupational History    Comment: and rental properties  Tobacco Use  . Smoking status: Never Smoker  . Smokeless tobacco: Never Used  Vaping Use  . Vaping Use: Never used  Substance and Sexual Activity  . Alcohol use: Yes    Alcohol/week: 4.0 -  6.0 standard drinks    Types: 4 - 6 Shots of liquor per week  . Drug use: No  . Sexual activity: Not on file  Other Topics Concern  . Not on file  Social History Narrative  . Not on file   Social Determinants of Health   Financial Resource Strain: Low Risk   . Difficulty of Paying Living Expenses: Not hard at all  Food Insecurity: No Food Insecurity  . Worried About  Charity fundraiser in the Last Year: Never true  . Ran Out of Food in the Last Year: Never true  Transportation Needs: No Transportation Needs  . Lack of Transportation (Medical): No  . Lack of Transportation (Non-Medical): No  Physical Activity: Sufficiently Active  . Days of Exercise per Week: 7 days  . Minutes of Exercise per Session: 30 min  Stress: No Stress Concern Present  . Feeling of Stress : Not at all  Social Connections: Moderately Integrated  . Frequency of Communication with Friends and Family: More than three times a week  . Frequency of Social Gatherings with Friends and Family: More than three times a week  . Attends Religious Services: Never  . Active Member of Clubs or Organizations: Yes  . Attends Archivist Meetings: More than 4 times per year  . Marital Status: Married    Tobacco Counseling Counseling given: Not Answered   Clinical Intake:  Pre-visit preparation completed: Yes  Pain : No/denies pain     Nutritional Risks: None Diabetes: No  How often do you need to have someone help you when you read instructions, pamphlets, or other written materials from your doctor or pharmacy?: 1 - Never  Diabetic? No  Interpreter Needed?: No  Information entered by :: [facility], LPN   Activities of Daily Living In your present state of health, do you have any difficulty performing the following activities: 07/10/2020 01/23/2020  Hearing? Y N  Comment In the process of getting hearing aids. -  Vision? N N  Difficulty concentrating or making decisions? N N  Walking or climbing stairs? N N  Dressing or bathing? N N  Doing errands, shopping? N N  Preparing Food and eating ? N N  Using the Toilet? N N  In the past six months, have you accidently leaked urine? N N  Do you have problems with loss of bowel control? N N  Managing your Medications? N N  Managing your Finances? N N  Housekeeping or managing your Housekeeping? N N  Some recent data  might be hidden    Patient Care Team: Jerrol Banana., MD as PCP - General (Family Medicine) Oneta Rack, MD (Dermatology) Vladimir Crofts, MD as Consulting Physician (Neurology)  Indicate any recent Medical Services you may have received from other than [facility] providers in the past year (date may be approximate).     Assessment:   This is a routine wellness examination for Robert Fuller.  Hearing/Vision screen No exam data present  Dietary issues and exercise activities discussed: Current Exercise Habits: Home exercise routine, Type of exercise: walking, Time (Minutes): 25, Frequency (Times/Week): 7, Weekly Exercise (Minutes/Week): 175, Intensity: Mild, Exercise limited by: None identified  Goals    . Care Management     Current Barriers:  . Care Coordination needs related to prediabetes.  (Hx of GERD)  Case Manager Clinical Goal(s):  Marland Kitchen Over the next 90 days, patient will continue to adhere to providers diet and treatment recommendations r/t prediabetes. Marland Kitchen  Over the next 90 days, patient will take all medications as prescribed. . Over the next 90 days, patient will attend all scheduled medical appointments.  Interventions:  . Reviewed medications. Encouraged to take medications as prescribed. Encouraged to notify care management team with concerns regarding prescription costs. . Discussed compliance with provider's recommendations and nutritional intake.  . Reviewed medical appointments. Encouraged to attend appointments as scheduled to prevent delays in care. Denies concerns regarding transportation. Reports pending appointment with Dr. Alice Reichert (Gastroenterology) on 02/08/20.   Patient Self Care Activities:  . Self administers medications  . Attends scheduled provider appointments . Calls pharmacy for medication refills . Attends church or other social activities . Performs ADL's independently . Performs IADL's independently . Calls provider office for new concerns or  questions  Please see past updates related to this goal by clicking on the "Past Updates" button in the selected goal      . DIET - REDUCE CALORIE INTAKE     Continue current diet plan of cutting out all white foods in diet to aid with weight loss.       Depression Screen PHQ 2/9 Scores 07/10/2020 01/23/2020 07/10/2019 12/29/2017 12/29/2017 08/04/2016  PHQ - 2 Score 0 0 0 0 0 0  PHQ- 9 Score - - - 0 - -    Fall Risk Fall Risk  07/10/2020 01/23/2020 07/10/2019 12/29/2017 08/04/2016  Falls in the past year? 0 0 0 No No  Number falls in past yr: 0 - - - -  Injury with Fall? 0 - - - -    Any stairs in or around the home? Yes  If so, are there any without handrails? No  Home free of loose throw rugs in walkways, pet beds, electrical cords, etc? Yes  Adequate lighting in your home to reduce risk of falls? Yes   ASSISTIVE DEVICES UTILIZED TO PREVENT FALLS:  Life alert? No  Use of a cane, walker or w/c? No  Grab bars in the bathroom? Yes  Shower chair or bench in shower? No  Elevated toilet seat or a handicapped toilet? Yes   Cognitive Function:     6CIT Screen 07/10/2020 07/10/2019  What Year? 0 points 0 points  What month? 0 points 0 points  What time? 0 points 0 points  Count back from 20 0 points 0 points  Months in reverse 0 points 0 points  Repeat phrase 0 points 0 points  Total Score 0 0    Immunizations Immunization History  Administered Date(s) Administered  . Moderna SARS-COVID-2 Vaccination 01/04/2020, 01/31/2020  . Pneumococcal Conjugate-13 12/29/2017  . Pneumococcal Polysaccharide-23 07/18/2019  . Rabies, IM 05/05/2016, 05/08/2016, 05/12/2016  . Tdap 07/06/2012  . Zoster 07/06/2012    TDAP status: Up to date Flu Vaccine status: Due fall 2021 Pneumococcal vaccine status: Up to date Covid-19 vaccine status: Completed vaccines  Qualifies for Shingles Vaccine? Yes   Zostavax completed Yes   Shingrix Completed?: No.    Education has been provided regarding the  importance of this vaccine. Patient has been advised to call insurance company to determine out of pocket expense if they have not yet received this vaccine. Advised may also receive vaccine at local pharmacy or Health Dept. Verbalized acceptance and understanding.  Screening Tests Health Maintenance  Topic Date Due  . INFLUENZA VACCINE  06/30/2020  . COLONOSCOPY  03/16/2021  . TETANUS/TDAP  07/06/2022  . COVID-19 Vaccine  Completed  . Hepatitis C Screening  Completed  . PNA vac Low Risk  Adult  Completed    Health Maintenance  Health Maintenance Due  Topic Date Due  . INFLUENZA VACCINE  06/30/2020    Colorectal cancer screening: Completed 03/16/18. Repeat every 3 years  Lung Cancer Screening: (Low Dose CT Chest recommended if Age 22-80 years, 30 pack-year currently smoking OR have quit w/in 15years.) does not qualify.   Additional Screening:  Hepatitis C Screening: Up to date  Vision Screening: Recommended annual ophthalmology exams for early detection of glaucoma and other disorders of the eye. Is the patient up to date with their annual eye exam?  Yes  Who is the provider or what is the name of the office in which the patient attends annual eye exams? Dr Atilano Median If pt is not established with a provider, would they like to be referred to a provider to establish care? No .   Dental Screening: Recommended annual dental exams for proper oral hygiene  Community Resource Referral / Chronic Care Management: CRR required this visit?  No   CCM required this visit?  No      Plan:     I have personally reviewed and noted the following in the patient's chart:   . Medical and social history . Use of alcohol, tobacco or illicit drugs  . Current medications and supplements . Functional ability and status . Nutritional status . Physical activity . Advanced directives . List of other physicians . Hospitalizations, surgeries, and ER visits in previous 12  months . Vitals . Screenings to include cognitive, depression, and falls . Referrals and appointments  In addition, I have reviewed and discussed with patient certain preventive protocols, quality metrics, and best practice recommendations. A written personalized care plan for preventive services as well as general preventive health recommendations were provided to patient.     Nasya Vincent [place], [place]   1/00/7121   Nurse Notes: None.

## 2020-07-10 ENCOUNTER — Other Ambulatory Visit: Payer: Self-pay

## 2020-07-10 ENCOUNTER — Ambulatory Visit (INDEPENDENT_AMBULATORY_CARE_PROVIDER_SITE_OTHER): Payer: Medicare HMO

## 2020-07-10 DIAGNOSIS — Z0000 Encounter for general adult medical examination without abnormal findings: Secondary | ICD-10-CM

## 2020-07-10 NOTE — Patient Instructions (Signed)
Robert Fuller , Thank you for taking time to come for your Medicare Wellness Visit. I appreciate your ongoing commitment to your health goals. Please review the following plan we discussed and let me know if I can assist you in the future.   Screening recommendations/referrals: Colonoscopy: Up to date, due 01/2021 Recommended yearly ophthalmology/optometry visit for glaucoma screening and checkup Recommended yearly dental visit for hygiene and checkup  Vaccinations: Influenza vaccine: Due fall 2021 Pneumococcal vaccine: Completed series Tdap vaccine: Up to date, due 06/2022 Shingles vaccine: Shingrix discussed. Please contact your pharmacy for coverage information.     Advanced directives: Declined bringing in a copy to scan into chart.   Conditions/risks identified: Continue current diet plan of cutting out all white foods in diet to aid with weight loss.   Next appointment: 07/17/20 @ 2:00 PM with Dr Rosanna Randy   Preventive Care 80 Years and Older, Male Preventive care refers to lifestyle choices and visits with your health care provider that can promote health and wellness. What does preventive care include?  A yearly physical exam. This is also called an annual well check.  Dental exams once or twice a year.  Routine eye exams. Ask your health care provider how often you should have your eyes checked.  Personal lifestyle choices, including:  Daily care of your teeth and gums.  Regular physical activity.  Eating a healthy diet.  Avoiding tobacco and drug use.  Limiting alcohol use.  Practicing safe sex.  Taking low doses of aspirin every day.  Taking vitamin and mineral supplements as recommended by your health care provider. What happens during an annual well check? The services and screenings done by your health care provider during your annual well check will depend on your age, overall health, lifestyle risk factors, and family history of disease. Counseling  Your  health care provider may ask you questions about your:  Alcohol use.  Tobacco use.  Drug use.  Emotional well-being.  Home and relationship well-being.  Sexual activity.  Eating habits.  History of falls.  Memory and ability to understand (cognition).  Work and work Statistician. Screening  You may have the following tests or measurements:  Height, weight, and BMI.  Blood pressure.  Lipid and cholesterol levels. These may be checked every 5 years, or more frequently if you are over 43 years old.  Skin check.  Lung cancer screening. You may have this screening every year starting at age 2 if you have a 30-pack-year history of smoking and currently smoke or have quit within the past 15 years.  Fecal occult blood test (FOBT) of the stool. You may have this test every year starting at age 68.  Flexible sigmoidoscopy or colonoscopy. You may have a sigmoidoscopy every 5 years or a colonoscopy every 10 years starting at age 63.  Prostate cancer screening. Recommendations will vary depending on your family history and other risks.  Hepatitis C blood test.  Hepatitis B blood test.  Sexually transmitted disease (STD) testing.  Diabetes screening. This is done by checking your blood sugar (glucose) after you have not eaten for a while (fasting). You may have this done every 1-3 years.  Abdominal aortic aneurysm (AAA) screening. You may need this if you are a current or former smoker.  Osteoporosis. You may be screened starting at age 18 if you are at high risk. Talk with your health care provider about your test results, treatment options, and if necessary, the need for more tests. Vaccines  Your  health care provider may recommend certain vaccines, such as:  Influenza vaccine. This is recommended every year.  Tetanus, diphtheria, and acellular pertussis (Tdap, Td) vaccine. You may need a Td booster every 10 years.  Zoster vaccine. You may need this after age  9.  Pneumococcal 13-valent conjugate (PCV13) vaccine. One dose is recommended after age 54.  Pneumococcal polysaccharide (PPSV23) vaccine. One dose is recommended after age 68. Talk to your health care provider about which screenings and vaccines you need and how often you need them. This information is not intended to replace advice given to you by your health care provider. Make sure you discuss any questions you have with your health care provider. Document Released: 12/13/2015 Document Revised: 08/05/2016 Document Reviewed: 09/17/2015 Elsevier Interactive Patient Education  2017 [place] Prevention in the Home Falls can cause injuries. They can happen to people of all ages. There are many things you can do to make your home safe and to help prevent falls. What can I do on the outside of my home?  Regularly fix the edges of walkways and driveways and fix any cracks.  Remove anything that might make you trip as you walk through a door, such as a raised step or threshold.  Trim any bushes or trees on the path to your home.  Use bright outdoor lighting.  Clear any walking paths of anything that might make someone trip, such as rocks or tools.  Regularly check to see if handrails are loose or broken. Make sure that both sides of any steps have handrails.  Any raised decks and porches should have guardrails on the edges.  Have any leaves, snow, or ice cleared regularly.  Use sand or salt on walking paths during winter.  Clean up any spills in your garage right away. This includes oil or grease spills. What can I do in the bathroom?  Use night lights.  Install grab bars by the toilet and in the tub and shower. Do not use towel bars as grab bars.  Use non-skid mats or decals in the tub or shower.  If you need to sit down in the shower, use a plastic, non-slip stool.  Keep the floor dry. Clean up any water that spills on the floor as soon as it happens.  Remove  soap buildup in the tub or shower regularly.  Attach bath mats securely with double-sided non-slip rug tape.  Do not have throw rugs and other things on the floor that can make you trip. What can I do in the bedroom?  Use night lights.  Make sure that you have a light by your bed that is easy to reach.  Do not use any sheets or blankets that are too big for your bed. They should not hang down onto the floor.  Have a firm chair that has side arms. You can use this for support while you get dressed.  Do not have throw rugs and other things on the floor that can make you trip. What can I do in the kitchen?  Clean up any spills right away.  Avoid walking on wet floors.  Keep items that you use a lot in easy-to-reach places.  If you need to reach something above you, use a strong step stool that has a grab bar.  Keep electrical cords out of the way.  Do not use floor polish or wax that makes floors slippery. If you must use wax, use non-skid floor wax.  Do not  have throw rugs and other things on the floor that can make you trip. What can I do with my stairs?  Do not leave any items on the stairs.  Make sure that there are handrails on both sides of the stairs and use them. Fix handrails that are broken or loose. Make sure that handrails are as long as the stairways.  Check any carpeting to make sure that it is firmly attached to the stairs. Fix any carpet that is loose or worn.  Avoid having throw rugs at the top or bottom of the stairs. If you do have throw rugs, attach them to the floor with carpet tape.  Make sure that you have a light switch at the top of the stairs and the bottom of the stairs. If you do not have them, ask someone to add them for you. What else can I do to help prevent falls?  Wear shoes that:  Do not have high heels.  Have rubber bottoms.  Are comfortable and fit you well.  Are closed at the toe. Do not wear sandals.  If you use a  stepladder:  Make sure that it is fully opened. Do not climb a closed stepladder.  Make sure that both sides of the stepladder are locked into place.  Ask someone to hold it for you, if possible.  Clearly mark and make sure that you can see:  Any grab bars or handrails.  First and last steps.  Where the edge of each step is.  Use tools that help you move around (mobility aids) if they are needed. These include:  Canes.  Walkers.  Scooters.  Crutches.  Turn on the lights when you go into a dark area. Replace any light bulbs as soon as they burn out.  Set up your furniture so you have a clear path. Avoid moving your furniture around.  If any of your floors are uneven, fix them.  If there are any pets around you, be aware of where they are.  Review your medicines with your doctor. Some medicines can make you feel dizzy. This can increase your chance of falling. Ask your doctor what other things that you can do to help prevent falls. This information is not intended to replace advice given to you by your health care provider. Make sure you discuss any questions you have with your health care provider. Document Released: 09/12/2009 Document Revised: 04/23/2016 Document Reviewed: 12/21/2014 Elsevier Interactive Patient Education  2017 [employer].

## 2020-07-15 DIAGNOSIS — R69 Illness, unspecified: Secondary | ICD-10-CM | POA: Diagnosis not present

## 2020-07-16 NOTE — Progress Notes (Signed)
I,[name],acting as a scribe for [name], MD.,have documented all relevant documentation on the behalf of [name], MD,as directed by  [name], MD while in the presence of [name], MD.   Complete physical exam   Patient: Robert Fuller   DOB: 08-25-1951   69 y.o. Male  MRN: [id number] Visit Date: 07/17/2020  Today's healthcare provider: [name], MD   Chief Complaint  Patient presents with  . Annual Exam   Subjective    Robert Fuller is a 69 y.o. male who presents today for a complete physical exam.  He reports consuming a general diet. Home exercise routine includes walking. He generally feels well. He reports sleeping well. He does not have additional problems to discuss today.  HPI  Patient had AWV with [facility] on 07/10/2020.  Past Medical History:  Diagnosis Date  . Barrett's esophagus   . Cough   . Esophageal reflux   . Kidney stone    Past Surgical History:  Procedure Laterality Date  . ESOPHAGOGASTRODUODENOSCOPY (EGD) WITH PROPOFOL N/A 04/06/2016   Procedure: ESOPHAGOGASTRODUODENOSCOPY (EGD) WITH PROPOFOL;  Surgeon: Hulen Luster, MD;  Location: [facility] ENDOSCOPY;  Service: Gastroenterology;  Laterality: N/A;  . NO PAST SURGERIES    . UPPER GI ENDOSCOPY  04/06/16   normal other than esophageal changes present consistent with barretts esophagus, repeat 2019   Social History   Socioeconomic History  . Marital status: Married    Spouse name: Not on file  . Number of children: 2  . Years of education: Not on file  . Highest education level: Bachelor's degree (e.g., BA, AB, BS)  Occupational History    Comment: and rental properties  Tobacco Use  . Smoking status: Never Smoker  . Smokeless tobacco: Never Used  Vaping Use  . Vaping Use: Never used  Substance and Sexual Activity  . Alcohol use: Yes    Alcohol/week: 4.0 - 6.0 standard drinks    Types: 4 - 6 Shots of liquor per week  . Drug use: No  . Sexual activity:  Not on file  Other Topics Concern  . Not on file  Social History Narrative  . Not on file   Social Determinants of Health   Financial Resource Strain: Low Risk   . Difficulty of Paying Living Expenses: Not hard at all  Food Insecurity: No Food Insecurity  . Worried About Charity fundraiser in the Last Year: Never true  . Ran Out of Food in the Last Year: Never true  Transportation Needs: No Transportation Needs  . Lack of Transportation (Medical): No  . Lack of Transportation (Non-Medical): No  Physical Activity: Sufficiently Active  . Days of Exercise per Week: 7 days  . Minutes of Exercise per Session: 30 min  Stress: No Stress Concern Present  . Feeling of Stress : Not at all  Social Connections: Moderately Integrated  . Frequency of Communication with Friends and Family: More than three times a week  . Frequency of Social Gatherings with Friends and Family: More than three times a week  . Attends Religious Services: Never  . Active Member of Clubs or Organizations: Yes  . Attends Archivist Meetings: More than 4 times per year  . Marital Status: Married  Human resources officer Violence: Not At Risk  . Fear of Current or Ex-Partner: No  . Emotionally Abused: No  . Physically Abused: No  . Sexually Abused: No   Family  Status  Relation Name Status  . Mother  Deceased  . Father  Deceased  . Sister  Alive  . Brother  Alive   Family History  Problem Relation Age of Onset  . Lung cancer Mother   . Parkinsonism Father    Allergies  Allergen Reactions  . Cefprozil   . Ketorolac Tromethamine   . Morphine   . Sulfa Antibiotics     Patient reports severe reactions depending on the strength.    Patient Care Team: Jerrol Banana., MD as PCP - General (Family Medicine) Oneta Rack, MD (Dermatology) Vladimir Crofts, MD as Consulting Physician (Neurology)   Medications: Outpatient Medications Prior to Visit  Medication Sig  . esomeprazole (NEXIUM) 20  MG capsule Take 20 mg by mouth daily at 12 noon.  . SODIUM FLUORIDE 5000 PPM DT as needed.    No facility-administered medications prior to visit.    Review of Systems  All other systems reviewed and are negative.     Objective    BP 128/82 (BP Location: Left Arm, Patient Position: Sitting, Cuff Size: Large)   Pulse 66   Temp 98.5 F (36.9 C) (Oral)   Resp 16   Ht 6\' 2"  (1.88 m)   Wt 225 lb (102.1 kg)   SpO2 98%   BMI 28.89 kg/m    Physical Exam Vitals reviewed.  Constitutional:      Appearance: Normal appearance.  HENT:     Head: Normocephalic and atraumatic.     Right Ear: External ear normal.     Left Ear: External ear normal.  Eyes:     General: No scleral icterus.    Conjunctiva/sclera: Conjunctivae normal.  Neck:     Vascular: No carotid bruit.  Cardiovascular:     Rate and Rhythm: Normal rate and regular rhythm.     Heart sounds: Normal heart sounds.  Pulmonary:     Breath sounds: Normal breath sounds.  Abdominal:     Palpations: Abdomen is soft.  Genitourinary:    Penis: Normal.      Testes: Normal.  Musculoskeletal:     Right lower leg: No edema.     Left lower leg: No edema.  Lymphadenopathy:     Cervical: No cervical adenopathy.  Skin:    General: Skin is warm and dry.  Neurological:     General: No focal deficit present.     Mental Status: He is alert and oriented to person, place, and time.  Psychiatric:        Mood and Affect: Mood normal.        Behavior: Behavior normal.        Thought Content: Thought content normal.        Judgment: Judgment normal.      General Appearance:     Well developed, well nourished male. Alert, cooperative, in no acute distress, appears stated age  Head:    Normocephalic, without obvious abnormality, atraumatic  Eyes:    PERRL, conjunctiva/corneas clear, EOM's intact, fundi    benign, both eyes       Ears:    Normal TM's and external ear canals, both ears  Nose:   Nares normal, septum midline, mucosa  normal, no drainage   or sinus tenderness  Throat:   Lips, mucosa, and tongue normal; teeth and gums normal  Neck:   Supple, symmetrical, trachea midline, no adenopathy;       thyroid:  No enlargement/tenderness/nodules; no carotid   bruit or  JVD  Back:     Symmetric, no curvature, ROM normal, no CVA tenderness  Lungs:     Clear to auscultation bilaterally, respirations unlabored  Chest wall:    No tenderness or deformity  Heart:    Normal heart rate. Normal rhythm. No murmurs, rubs, or gallops.  S1 and S2 normal  Abdomen:     Soft, non-tender, bowel sounds active all four quadrants,    no masses, no organomegaly  Genitalia:    normal, deferred  Rectal:    normal tone, normal prostate, no masses or tenderness  Extremities:   All extremities are intact. No cyanosis or edema  Pulses:   2+ and symmetric all extremities  Skin:   Skin color, texture, turgor normal, no rashes or lesions  Lymph nodes:   Cervical, supraclavicular, and axillary nodes normal  Neurologic:   CNII-XII intact. Normal strength, sensation and reflexes      throughout     Last depression screening scores PHQ 2/9 Scores 07/10/2020 01/23/2020 07/10/2019  PHQ - 2 Score 0 0 0  PHQ- 9 Score - - -   Last fall risk screening Fall Risk  07/10/2020  Falls in the past year? 0  Number falls in past yr: 0  Injury with Fall? 0   Last Audit-C alcohol use screening Alcohol Use Disorder Test (AUDIT) 07/10/2020  1. How often do you have a drink containing alcohol? 3  2. How many drinks containing alcohol do you have on a typical day when you are drinking? 0  3. How often do you have six or more drinks on one occasion? 0  AUDIT-C Score 3  4. How often during the last year have you found that you were not able to stop drinking once you had started? 0  5. How often during the last year have you failed to do what was normally expected from you because of drinking? 0  6. How often during the last year have you needed a first drink in  the morning to get yourself going after a heavy drinking session? 0  7. How often during the last year have you had a feeling of guilt of remorse after drinking? 0  8. How often during the last year have you been unable to remember what happened the night before because you had been drinking? 0  9. Have you or someone else been injured as a result of your drinking? 0  10. Has a relative or friend or a doctor or another health worker been concerned about your drinking or suggested you cut down? 0  Alcohol Use Disorder Identification Test Final Score (AUDIT) 3  Alcohol Brief Interventions/Follow-up AUDIT Score <7 follow-up not indicated   A score of 3 or more in women, and 4 or more in men indicates increased risk for alcohol abuse, EXCEPT if all of the points are from question 1   No results found for any visits on 07/17/20.  Assessment & Plan    Routine Health Maintenance and Physical Exam  Exercise Activities and Dietary recommendations Goals    . Care Management     Current Barriers:  . Care Coordination needs related to prediabetes.  (Hx of GERD)  Case Manager Clinical Goal(s):  Marland Kitchen Over the next 90 days, patient will continue to adhere to providers diet and treatment recommendations r/t prediabetes. . Over the next 90 days, patient will take all medications as prescribed. . Over the next 90 days, patient will attend all scheduled medical appointments.  Interventions:  . Reviewed medications. Encouraged to take medications as prescribed. Encouraged to notify care management team with concerns regarding prescription costs. . Discussed compliance with provider's recommendations and nutritional intake.  . Reviewed medical appointments. Encouraged to attend appointments as scheduled to prevent delays in care. Denies concerns regarding transportation. Reports pending appointment with Dr. Alice Reichert (Gastroenterology) on 02/08/20.   Patient Self Care Activities:  . Self administers medications   . Attends scheduled provider appointments . Calls pharmacy for medication refills . Attends church or other social activities . Performs ADL's independently . Performs IADL's independently . Calls provider office for new concerns or questions  Please see past updates related to this goal by clicking on the "Past Updates" button in the selected goal      . DIET - REDUCE CALORIE INTAKE     Continue current diet plan of cutting out all white foods in diet to aid with weight loss.        Immunization History  Administered Date(s) Administered  . Moderna SARS-COVID-2 Vaccination 01/04/2020, 01/31/2020  . Pneumococcal Conjugate-13 12/29/2017  . Pneumococcal Polysaccharide-23 07/18/2019  . Rabies, IM 05/05/2016, 05/08/2016, 05/12/2016  . Tdap 07/06/2012  . Zoster 07/06/2012    Health Maintenance  Topic Date Due  . INFLUENZA VACCINE  06/30/2020  . COLONOSCOPY  03/16/2021  . TETANUS/TDAP  07/06/2022  . COVID-19 Vaccine  Completed  . Hepatitis C Screening  Completed  . PNA vac Low Risk Adult  Completed    Discussed health benefits of physical activity, and encouraged him to engage in regular exercise appropriate for his age and condition.  1. Annual physical exam  - Comprehensive metabolic panel - Lipid panel - TSH - CBC - Hemoglobin A1c  2. Pre-diabetes  - Hemoglobin A1c  3. Encounter for special screening examination for cardiovascular disorder  - Lipid panel  4. Encounter for screening for hematologic disorder  - CBC  5. Screening for metabolic disorder Patient now on vitamin D D and B12 daily - Comprehensive metabolic panel  6. Screening for thyroid disorder  - TSH   Return in about 1 year (around 07/17/2021) for CPE.        Winta Barcelo Cranford Mon, MD  [facility] [phone] (phone) [phone] (fax)  [place]

## 2020-07-17 ENCOUNTER — Other Ambulatory Visit: Payer: Self-pay

## 2020-07-17 ENCOUNTER — Ambulatory Visit (INDEPENDENT_AMBULATORY_CARE_PROVIDER_SITE_OTHER): Payer: Medicare HMO | Admitting: Family Medicine

## 2020-07-17 ENCOUNTER — Encounter: Payer: Self-pay | Admitting: Family Medicine

## 2020-07-17 VITALS — BP 128/82 | HR 66 | Temp 98.5°F | Resp 16 | Ht 74.0 in | Wt 225.0 lb

## 2020-07-17 DIAGNOSIS — Z130 Encounter for screening for diseases of the blood and blood-forming organs and certain disorders involving the immune mechanism: Secondary | ICD-10-CM

## 2020-07-17 DIAGNOSIS — Z136 Encounter for screening for cardiovascular disorders: Secondary | ICD-10-CM | POA: Diagnosis not present

## 2020-07-17 DIAGNOSIS — Z13228 Encounter for screening for other metabolic disorders: Secondary | ICD-10-CM | POA: Diagnosis not present

## 2020-07-17 DIAGNOSIS — Z0000 Encounter for general adult medical examination without abnormal findings: Secondary | ICD-10-CM | POA: Diagnosis not present

## 2020-07-17 DIAGNOSIS — Z1329 Encounter for screening for other suspected endocrine disorder: Secondary | ICD-10-CM | POA: Diagnosis not present

## 2020-07-17 DIAGNOSIS — R7303 Prediabetes: Secondary | ICD-10-CM | POA: Diagnosis not present

## 2020-07-22 ENCOUNTER — Encounter: Payer: Self-pay | Admitting: Family Medicine

## 2020-07-23 DIAGNOSIS — Z13228 Encounter for screening for other metabolic disorders: Secondary | ICD-10-CM | POA: Diagnosis not present

## 2020-07-23 DIAGNOSIS — R7303 Prediabetes: Secondary | ICD-10-CM | POA: Diagnosis not present

## 2020-07-23 DIAGNOSIS — Z136 Encounter for screening for cardiovascular disorders: Secondary | ICD-10-CM | POA: Diagnosis not present

## 2020-07-23 DIAGNOSIS — Z130 Encounter for screening for diseases of the blood and blood-forming organs and certain disorders involving the immune mechanism: Secondary | ICD-10-CM | POA: Diagnosis not present

## 2020-07-23 DIAGNOSIS — Z0000 Encounter for general adult medical examination without abnormal findings: Secondary | ICD-10-CM | POA: Diagnosis not present

## 2020-07-23 DIAGNOSIS — Z1329 Encounter for screening for other suspected endocrine disorder: Secondary | ICD-10-CM | POA: Diagnosis not present

## 2020-07-24 LAB — COMPREHENSIVE METABOLIC PANEL
ALT: 38 IU/L (ref 0–44)
AST: 22 IU/L (ref 0–40)
Albumin/Globulin Ratio: 2.3 — ABNORMAL HIGH (ref 1.2–2.2)
Albumin: 4.4 g/dL (ref 3.8–4.8)
Alkaline Phosphatase: 72 IU/L (ref 48–121)
BUN/Creatinine Ratio: 14 (ref 10–24)
BUN: 14 mg/dL (ref 8–27)
Bilirubin Total: 1.7 mg/dL — ABNORMAL HIGH (ref 0.0–1.2)
CO2: 21 mmol/L (ref 20–29)
Calcium: 8.9 mg/dL (ref 8.6–10.2)
Chloride: 103 mmol/L (ref 96–106)
Creatinine, Ser: 0.98 mg/dL (ref 0.76–1.27)
GFR calc Af Amer: 90 mL/min/{1.73_m2} (ref >59)
GFR calc non Af Amer: 78 mL/min/{1.73_m2} (ref >59)
Globulin, Total: 1.9 g/dL (ref 1.5–4.5)
Glucose: 110 mg/dL — ABNORMAL HIGH (ref 65–99)
Potassium: 4.1 mmol/L (ref 3.5–5.2)
Sodium: 139 mmol/L (ref 134–144)
Total Protein: 6.3 g/dL (ref 6.0–8.5)

## 2020-07-24 LAB — CBC
Hematocrit: 45.1 % (ref 37.5–51.0)
Hemoglobin: 16.2 g/dL (ref 13.0–17.7)
MCH: 33.3 pg — ABNORMAL HIGH (ref 26.6–33.0)
MCHC: 35.9 g/dL — ABNORMAL HIGH (ref 31.5–35.7)
MCV: 93 fL (ref 79–97)
Platelets: 162 10*3/uL (ref 150–450)
RBC: 4.86 x10E6/uL (ref 4.14–5.80)
RDW: 13.5 % (ref 11.6–15.4)
WBC: 3.8 10*3/uL (ref 3.4–10.8)

## 2020-07-24 LAB — LIPID PANEL
Chol/HDL Ratio: 3.4 ratio (ref 0.0–5.0)
Cholesterol, Total: 178 mg/dL (ref 100–199)
HDL: 53 mg/dL (ref >39)
LDL Chol Calc (NIH): 102 mg/dL — ABNORMAL HIGH (ref 0–99)
Triglycerides: 128 mg/dL (ref 0–149)
VLDL Cholesterol Cal: 23 mg/dL (ref 5–40)

## 2020-07-24 LAB — HEMOGLOBIN A1C
Est. average glucose Bld gHb Est-mCnc: 123 mg/dL
Hgb A1c MFr Bld: 5.9 % — ABNORMAL HIGH (ref 4.8–5.6)

## 2020-07-24 LAB — TSH: TSH: 2.76 u[IU]/mL (ref 0.450–4.500)

## 2020-07-25 ENCOUNTER — Telehealth: Payer: Self-pay

## 2020-07-25 NOTE — Telephone Encounter (Signed)
Patient advised of lab results

## 2020-07-25 NOTE — Telephone Encounter (Signed)
-----   Message from Jerrol Banana., MD sent at 07/25/2020 12:58 PM EDT ----- Labs all in normal range.

## 2020-08-08 DIAGNOSIS — R202 Paresthesia of skin: Secondary | ICD-10-CM | POA: Diagnosis not present

## 2020-08-08 DIAGNOSIS — R200 Anesthesia of skin: Secondary | ICD-10-CM | POA: Diagnosis not present

## 2020-08-19 DIAGNOSIS — M25819 Other specified joint disorders, unspecified shoulder: Secondary | ICD-10-CM | POA: Diagnosis not present

## 2020-08-19 DIAGNOSIS — S93421A Sprain of deltoid ligament of right ankle, initial encounter: Secondary | ICD-10-CM | POA: Diagnosis not present

## 2020-09-09 DIAGNOSIS — S93421A Sprain of deltoid ligament of right ankle, initial encounter: Secondary | ICD-10-CM | POA: Diagnosis not present

## 2020-10-29 DIAGNOSIS — G5603 Carpal tunnel syndrome, bilateral upper limbs: Secondary | ICD-10-CM | POA: Diagnosis not present

## 2020-10-31 ENCOUNTER — Other Ambulatory Visit: Payer: Self-pay | Admitting: Neurology

## 2020-10-31 DIAGNOSIS — R200 Anesthesia of skin: Secondary | ICD-10-CM

## 2020-10-31 DIAGNOSIS — G5603 Carpal tunnel syndrome, bilateral upper limbs: Secondary | ICD-10-CM

## 2020-11-02 ENCOUNTER — Ambulatory Visit
Admission: RE | Admit: 2020-11-02 | Discharge: 2020-11-02 | Disposition: A | Discharge status: Home / Self Care | Payer: Medicare HMO | Source: Ambulatory Visit | Attending: Neurology | Admitting: Neurology

## 2020-11-02 ENCOUNTER — Other Ambulatory Visit: Payer: Self-pay

## 2020-11-02 DIAGNOSIS — R200 Anesthesia of skin: Secondary | ICD-10-CM | POA: Diagnosis not present

## 2020-11-02 DIAGNOSIS — G5603 Carpal tunnel syndrome, bilateral upper limbs: Secondary | ICD-10-CM | POA: Insufficient documentation

## 2020-11-02 DIAGNOSIS — M47812 Spondylosis without myelopathy or radiculopathy, cervical region: Secondary | ICD-10-CM | POA: Diagnosis not present

## 2020-12-30 DIAGNOSIS — H2513 Age-related nuclear cataract, bilateral: Secondary | ICD-10-CM | POA: Diagnosis not present

## 2021-02-25 DIAGNOSIS — R292 Abnormal reflex: Secondary | ICD-10-CM | POA: Diagnosis not present

## 2021-02-25 DIAGNOSIS — R202 Paresthesia of skin: Secondary | ICD-10-CM | POA: Diagnosis not present

## 2021-02-25 DIAGNOSIS — R200 Anesthesia of skin: Secondary | ICD-10-CM | POA: Diagnosis not present

## 2021-02-25 DIAGNOSIS — H9193 Unspecified hearing loss, bilateral: Secondary | ICD-10-CM | POA: Diagnosis not present

## 2021-04-10 ENCOUNTER — Other Ambulatory Visit: Payer: Self-pay

## 2021-04-10 ENCOUNTER — Ambulatory Visit
Admission: EM | Admit: 2021-04-10 | Discharge: 2021-04-10 | Disposition: A | Discharge status: Home / Self Care | Payer: Medicare HMO | Attending: Emergency Medicine | Admitting: Emergency Medicine

## 2021-04-10 DIAGNOSIS — J069 Acute upper respiratory infection, unspecified: Secondary | ICD-10-CM | POA: Diagnosis not present

## 2021-04-10 NOTE — ED Triage Notes (Signed)
Pt presents with cough, sore throat when swallowing especially in morning, HA, fatigue.  Home COVID was negative.  Took Ibuprofen and OTC cold med at home with minimal relief. Denies fever at home as well as SOB.

## 2021-04-10 NOTE — ED Provider Notes (Signed)
Robert Fuller    CSN: [id number] Arrival date & time: 04/10/21  1020      History   Chief Complaint Chief Complaint  Patient presents with  . Cough    HPI Robert Fuller is a 70 y.o. male.   Patient presents with 5 to 6-day history of fatigue, headache, postnasal drip, congestion, sore throat, cough.  He denies fever, rash, shortness of breath, vomiting, diarrhea, or other symptoms.  Treatment attempted at home with ibuprofen and allergy medication.  He had a negative COVID test at home but requests a PCR today.  His medical history includes GERD, Barrett's esophagitis, kidney stones.  The history is provided by the patient and medical records.    Past Medical History:  Diagnosis Date  . Barrett's esophagus   . Cough   . Esophageal reflux   . Kidney stone     Patient Active Problem List   Diagnosis Date Noted  . 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 G E R D 02/05/2011  . BARRETTS ESOPHAGUS 02/05/2011  . COUGH 02/05/2011  . Avitaminosis D 03/01/2009  . 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 ESOPHAGOGASTRODUODENOSCOPY (EGD) WITH PROPOFOL N/A 04/06/2016   Procedure: ESOPHAGOGASTRODUODENOSCOPY (EGD) WITH PROPOFOL;  Surgeon: Hulen Luster, MD;  Location: [facility] ENDOSCOPY;  Service: Gastroenterology;  Laterality: N/A;  . NO PAST SURGERIES    . UPPER GI ENDOSCOPY  04/06/16   normal other than esophageal changes present consistent with barretts esophagus, repeat 2019       Home Medications    Prior to Admission medications   Medication Sig Start Date End Date Taking? Authorizing Provider  esomeprazole (NEXIUM) 20 MG capsule Take 20 mg by mouth daily at 12 noon.   Yes [provider]  SODIUM FLUORIDE 5000 PPM DT as needed.  05/25/19   [provider]    Family History Family History  Problem Relation Age of Onset  .  Lung cancer Mother   . Parkinsonism Father     Social History Social History   Tobacco Use  . Smoking status: Never Smoker  . Smokeless tobacco: Never Used  Vaping Use  . Vaping Use: Never used  Substance Use Topics  . Alcohol use: Yes    Alcohol/week: 4.0 - 6.0 standard drinks    Types: 4 - 6 Shots of liquor per week  . Drug use: No     Allergies   Cefprozil, Ketorolac tromethamine, Morphine, and Sulfa antibiotics   Review of Systems Review of Systems  Constitutional: Positive for fatigue. Negative for chills and fever.  HENT: Positive for congestion, postnasal drip, rhinorrhea and sore throat. Negative for ear pain.   Respiratory: Positive for cough. Negative for shortness of breath.   Cardiovascular: Negative for chest pain and palpitations.  Gastrointestinal: Negative for abdominal pain, diarrhea and vomiting.  Skin: Negative for color change and rash.  Neurological: Positive for headaches. Negative for weakness and numbness.  All other systems reviewed and are negative.    Physical Exam Triage Vital Signs ED Triage Vitals  Enc Vitals Group     BP      Pulse      Resp      Temp      Temp src      SpO2      Weight      Height  Head Circumference      Peak Flow      Pain Score      Pain Loc      Pain Edu?      Excl. in [place]?    No data found.  Updated Vital Signs BP (!) 136/96 (BP Location: Right Arm)   Pulse 74   Temp 99.5 F (37.5 C) (Oral)   Resp 18   SpO2 95%   Visual Acuity Right Eye Distance:   Left Eye Distance:   Bilateral Distance:    Right Eye Near:   Left Eye Near:    Bilateral Near:     Physical Exam Vitals and nursing note reviewed.  Constitutional:      General: He is not in acute distress.    Appearance: He is well-developed.  HENT:     Head: Normocephalic and atraumatic.     Left Ear: Tympanic membrane normal.     Nose: Rhinorrhea present.     Mouth/Throat:     Mouth: Mucous membranes are moist.     Pharynx:  Oropharynx is clear.     Comments: Clear postnasal drip. Eyes:     Conjunctiva/sclera: Conjunctivae normal.  Cardiovascular:     Rate and Rhythm: Normal rate and regular rhythm.     Heart sounds: Normal heart sounds.  Pulmonary:     Effort: Pulmonary effort is normal. No respiratory distress.     Breath sounds: Normal breath sounds.  Abdominal:     Palpations: Abdomen is soft.     Tenderness: There is no abdominal tenderness.  Musculoskeletal:     Cervical back: Neck supple.  Skin:    General: Skin is warm and dry.  Neurological:     General: No focal deficit present.     Mental Status: He is alert and oriented to person, place, and time.     Gait: Gait normal.  Psychiatric:        Mood and Affect: Mood normal.        Behavior: Behavior normal.      UC Treatments / Results  Labs (all labs ordered are listed, but only abnormal results are displayed) Labs Reviewed  NOVEL CORONAVIRUS, NAA    EKG   Radiology No results found.  Procedures Procedures (including critical care time)  Medications Ordered in UC Medications - No data to display  Initial Impression / Assessment and Plan / UC Course  I have reviewed the triage vital signs and the nursing notes.  Pertinent labs & imaging results that were available during my care of the patient were reviewed by me and considered in my medical decision making (see chart for details).   Viral URI with cough.  Patient declines prescription cough medication.  Discussed continued symptomatic treatment.  PCR COVID pending.  Instructed patient to self quarantine until the test result is back.  Instructed him to follow-up with his PCP if his symptoms are not improving.  He agrees to plan of care.   Final Clinical Impressions(s) / UC Diagnoses   Final diagnoses:  Viral URI with cough     Discharge Instructions     Your COVID test is pending.  You should self quarantine until the test result is back.    Take Tylenol or  ibuprofen as needed for fever or discomfort.  Rest and keep yourself hydrated.    Follow-up with your primary care provider if your symptoms are not improving.        ED Prescriptions  None     PDMP not reviewed this encounter.   Sharion Balloon, NP 04/10/21 1110

## 2021-04-10 NOTE — Discharge Instructions (Signed)
Your COVID test is pending.  You should self quarantine until the test result is back.    Take Tylenol or ibuprofen as needed for fever or discomfort.  Rest and keep yourself hydrated.    Follow-up with your primary care provider if your symptoms are not improving.     

## 2021-04-11 LAB — NOVEL CORONAVIRUS, NAA: SARS-CoV-2, NAA: DETECTED — AB

## 2021-04-11 LAB — SARS-COV-2, NAA 2 DAY TAT

## 2021-04-12 ENCOUNTER — Telehealth: Payer: Self-pay | Admitting: Infectious Diseases

## 2021-04-12 NOTE — Telephone Encounter (Signed)
Called to discuss with patient about COVID-19 symptoms and the use of one of the available treatments for those with mild to moderate Covid symptoms and at a high risk of hospitalization.  Pt appears to qualify for outpatient treatment due to co-morbid conditions and/or a member of an at-risk group in accordance with the FDA Emergency Use Authorization.    Symptom onset: around 7d now Vaccinated: yes Booster? Yes (double) Immunocompromised? no Qualifiers: age NIH Criteria: 2  He is feeling back to normal and much better than before. Only really with mild symptoms anyway.  We discussed isolation recommendations and answered any other questions he had related to the COVID infection.    Robert Fuller

## 2021-04-16 DIAGNOSIS — D2272 Melanocytic nevi of left lower limb, including hip: Secondary | ICD-10-CM | POA: Diagnosis not present

## 2021-04-16 DIAGNOSIS — D2261 Melanocytic nevi of right upper limb, including shoulder: Secondary | ICD-10-CM | POA: Diagnosis not present

## 2021-04-16 DIAGNOSIS — D2262 Melanocytic nevi of left upper limb, including shoulder: Secondary | ICD-10-CM | POA: Diagnosis not present

## 2021-04-16 DIAGNOSIS — L821 Other seborrheic keratosis: Secondary | ICD-10-CM | POA: Diagnosis not present

## 2021-04-16 DIAGNOSIS — D2271 Melanocytic nevi of right lower limb, including hip: Secondary | ICD-10-CM | POA: Diagnosis not present

## 2021-04-16 DIAGNOSIS — D225 Melanocytic nevi of trunk: Secondary | ICD-10-CM | POA: Diagnosis not present

## 2021-07-15 DIAGNOSIS — H2513 Age-related nuclear cataract, bilateral: Secondary | ICD-10-CM | POA: Diagnosis not present

## 2021-07-21 ENCOUNTER — Ambulatory Visit (INDEPENDENT_AMBULATORY_CARE_PROVIDER_SITE_OTHER): Payer: Medicare HMO | Admitting: Family Medicine

## 2021-07-21 ENCOUNTER — Other Ambulatory Visit: Payer: Self-pay

## 2021-07-21 ENCOUNTER — Encounter: Payer: Self-pay | Admitting: Family Medicine

## 2021-07-21 VITALS — BP 131/83 | HR 59 | Temp 98.4°F | Resp 16 | Ht 74.0 in | Wt 228.0 lb

## 2021-07-21 DIAGNOSIS — Z0000 Encounter for general adult medical examination without abnormal findings: Secondary | ICD-10-CM | POA: Diagnosis not present

## 2021-07-21 DIAGNOSIS — K2270 Barrett's esophagus without dysplasia: Secondary | ICD-10-CM

## 2021-07-21 DIAGNOSIS — Z1152 Encounter for screening for COVID-19: Secondary | ICD-10-CM

## 2021-07-21 DIAGNOSIS — E559 Vitamin D deficiency, unspecified: Secondary | ICD-10-CM

## 2021-07-21 DIAGNOSIS — Z13228 Encounter for screening for other metabolic disorders: Secondary | ICD-10-CM | POA: Diagnosis not present

## 2021-07-21 DIAGNOSIS — R7303 Prediabetes: Secondary | ICD-10-CM | POA: Diagnosis not present

## 2021-07-21 NOTE — Progress Notes (Signed)
Annual Wellness Visit     Patient: Robert Fuller, Male    DOB: July 24, 1951, 70 y.o.   MRN: [id number] Visit Date: 07/21/2021  Today's Provider: [name], MD   Chief Complaint  Patient presents with   Annual Exam   Subjective    Robert Fuller is a 70 y.o. male who presents today for his Annual Wellness Visit. He reports consuming a general diet. Home exercise routine includes walking. He generally feels well. He reports sleeping well. He does not have additional problems to discuss today.  He has some chronic pains which are helped with horse liniment     Medications: Outpatient Medications Prior to Visit  Medication Sig   esomeprazole (NEXIUM) 20 MG capsule Take 20 mg by mouth daily at 12 noon.   SODIUM FLUORIDE 5000 PPM DT as needed.    No facility-administered medications prior to visit.    Allergies  Allergen Reactions   Cefprozil    Ketorolac Tromethamine    Morphine    Sulfa Antibiotics     Patient reports severe reactions depending on the strength.    Patient Care Team: Jerrol Banana., MD as PCP - General (Family Medicine) Oneta Rack, MD (Dermatology) Vladimir Crofts, MD as Consulting Physician (Neurology)  Review of Systems  All other systems reviewed and are negative.       Objective    Vitals: BP 131/83   Pulse (!) 59   Temp 98.4 F (36.9 C)   Resp 16   Ht '6\' 2"'$  (1.88 m)   Wt 228 lb (103.4 kg)   BMI 29.27 kg/m  BP Readings from Last 3 Encounters:  07/21/21 131/83  04/10/21 (!) 136/96  07/17/20 128/82   Wt Readings from Last 3 Encounters:  07/21/21 228 lb (103.4 kg)  07/17/20 225 lb (102.1 kg)  01/17/20 228 lb (103.4 kg)      Physical Exam Vitals reviewed.  Constitutional:      Appearance: Normal appearance.  HENT:     Head: Normocephalic and atraumatic.     Right Ear: External ear normal.     Left Ear: External ear normal.  Eyes:     General: No scleral icterus.    Conjunctiva/sclera: Conjunctivae  normal.  Neck:     Vascular: No carotid bruit.  Cardiovascular:     Rate and Rhythm: Normal rate and regular rhythm.     Heart sounds: Normal heart sounds.  Pulmonary:     Breath sounds: Normal breath sounds.  Abdominal:     Palpations: Abdomen is soft.  Genitourinary:    Penis: Normal.      Testes: Normal.  Musculoskeletal:     Right lower leg: No edema.     Left lower leg: No edema.  Lymphadenopathy:     Cervical: No cervical adenopathy.  Skin:    General: Skin is warm and dry.  Neurological:     General: No focal deficit present.     Mental Status: He is alert and oriented to person, place, and time.  Psychiatric:        Mood and Affect: Mood normal.        Behavior: Behavior normal.        Thought Content: Thought content normal.        Judgment: Judgment normal.     Most recent functional status assessment: No flowsheet data found. Most recent fall risk assessment: Fall Risk  07/10/2020  Falls in the past year?  0  Number falls in past yr: 0  Injury with Fall? 0    Most recent depression screenings: PHQ 2/9 Scores 07/10/2020 01/23/2020  PHQ - 2 Score 0 0  PHQ- 9 Score - -   Most recent cognitive screening: 6CIT Screen 07/10/2020  What Year? 0 points  What month? 0 points  What time? 0 points  Count back from 20 0 points  Months in reverse 0 points  Repeat phrase 0 points  Total Score 0   Most recent Audit-C alcohol use screening Alcohol Use Disorder Test (AUDIT) 07/10/2020  1. How often do you have a drink containing alcohol? 3  2. How many drinks containing alcohol do you have on a typical day when you are drinking? 0  3. How often do you have six or more drinks on one occasion? 0  AUDIT-C Score 3  4. How often during the last year have you found that you were not able to stop drinking once you had started? 0  5. How often during the last year have you failed to do what was normally expected from you because of drinking? 0  6. How often during the last  year have you needed a first drink in the morning to get yourself going after a heavy drinking session? 0  7. How often during the last year have you had a feeling of guilt of remorse after drinking? 0  8. How often during the last year have you been unable to remember what happened the night before because you had been drinking? 0  9. Have you or someone else been injured as a result of your drinking? 0  10. Has a relative or friend or a doctor or another health worker been concerned about your drinking or suggested you cut down? 0  Alcohol Use Disorder Identification Test Final Score (AUDIT) 3  Alcohol Brief Interventions/Follow-up AUDIT Score <7 follow-up not indicated   A score of 3 or more in women, and 4 or more in men indicates increased risk for alcohol abuse, EXCEPT if all of the points are from question 1   No results found for any visits on 07/21/21.  Assessment & Plan     Annual wellness visit done today including the all of the following: Reviewed patient's Family Medical History Reviewed and updated list of patient's medical providers Assessment of cognitive impairment was done Assessed patient's functional ability Established a written schedule for health screening [place] Completed and Reviewed  Exercise Activities and Dietary recommendations  Goals      Care Management     Current Barriers:  Care Coordination needs related to prediabetes.  (Hx of GERD)  Case Manager Clinical Goal(s):  Over the next 90 days, patient will continue to adhere to providers diet and treatment recommendations r/t prediabetes. Over the next 90 days, patient will take all medications as prescribed. Over the next 90 days, patient will attend all scheduled medical appointments.  Interventions:  Reviewed medications. Encouraged to take medications as prescribed. Encouraged to notify care management team with concerns regarding prescription costs. Discussed compliance  with provider's recommendations and nutritional intake.  Reviewed medical appointments. Encouraged to attend appointments as scheduled to prevent delays in care. Denies concerns regarding transportation. Reports pending appointment with Dr. Alice Reichert (Gastroenterology) on 02/08/20.   Patient Self Care Activities:  Self administers medications  Attends scheduled provider appointments Calls pharmacy for medication refills Attends church or other social activities Performs ADL's independently Performs IADL's independently Calls provider  office for new concerns or questions  Please see past updates related to this goal by clicking on the "Past Updates" button in the selected goal       DIET - REDUCE CALORIE INTAKE     Continue current diet plan of cutting out all white foods in diet to aid with weight loss.         Immunization History  Administered Date(s) Administered   Moderna Sars-Covid-2 Vaccination 01/04/2020, 01/31/2020   Pneumococcal Conjugate-13 12/29/2017   Pneumococcal Polysaccharide-23 07/18/2019   Rabies, IM 05/05/2016, 05/08/2016, 05/12/2016   Tdap 07/06/2012   Zoster, Live 07/06/2012    Health Maintenance  Topic Date Due   Zoster Vaccines- Shingrix (1 of 2) Never done   COVID-19 Vaccine (3 - Booster for Moderna series) 07/02/2020   COLONOSCOPY (Pts 45-80yr Insurance coverage will need to be confirmed)  03/16/2021   INFLUENZA VACCINE  06/30/2021   TETANUS/TDAP  07/06/2022   Hepatitis C Screening  Completed   PNA vac Low Risk Adult  Completed   HPV VACCINES  Aged Out    1. Encounter for annual wellness visit (AWV) in Medicare patient   2. Annual physical exam   3. Pre-diabetes  - CBC with Differential/Platelet - Comprehensive metabolic panel - Hemoglobin A1c - Lipid panel - TSH  4. Avitaminosis D   5. Barrett's esophagus without dysplasia Flow PPI - CBC with Differential/Platelet  6. Disorder of bilirubin excretion  - Comprehensive metabolic  panel  7. Screening for metabolic disorder  - Lipid panel - TSH  8. Encounter for screening for COVID-19  - SARS-CoV-2 Antibodies   Discussed health benefits of physical activity, and encouraged him to engage in regular exercise appropriate for his age and condition.      No follow-ups on file.     I, RWilhemena Durie MD, have reviewed all documentation for this visit. The documentation on 07/27/21 for the exam, diagnosis, procedures, and orders are all accurate and complete.    Devontay Celaya GCranford Mon MD  [phone](phone) [phone](fax)  [place]

## 2021-07-21 NOTE — Patient Instructions (Signed)
Continue B12 1074mg daily and Vit D3 2000u daily.

## 2021-07-24 DIAGNOSIS — K2270 Barrett's esophagus without dysplasia: Secondary | ICD-10-CM | POA: Diagnosis not present

## 2021-07-24 DIAGNOSIS — R7303 Prediabetes: Secondary | ICD-10-CM | POA: Diagnosis not present

## 2021-07-24 DIAGNOSIS — Z13228 Encounter for screening for other metabolic disorders: Secondary | ICD-10-CM | POA: Diagnosis not present

## 2021-07-24 DIAGNOSIS — E785 Hyperlipidemia, unspecified: Secondary | ICD-10-CM | POA: Diagnosis not present

## 2021-07-25 LAB — CBC WITH DIFFERENTIAL/PLATELET
Basophils Absolute: 0.1 10*3/uL (ref 0.0–0.2)
Basos: 1 %
EOS (ABSOLUTE): 0.1 10*3/uL (ref 0.0–0.4)
Eos: 3 %
Hematocrit: 47.8 % (ref 37.5–51.0)
Hemoglobin: 16.6 g/dL (ref 13.0–17.7)
Immature Grans (Abs): 0 10*3/uL (ref 0.0–0.1)
Immature Granulocytes: 0 %
Lymphocytes Absolute: 1.6 10*3/uL (ref 0.7–3.1)
Lymphs: 44 %
MCH: 32.5 pg (ref 26.6–33.0)
MCHC: 34.7 g/dL (ref 31.5–35.7)
MCV: 94 fL (ref 79–97)
Monocytes Absolute: 0.5 10*3/uL (ref 0.1–0.9)
Monocytes: 12 %
Neutrophils Absolute: 1.5 10*3/uL (ref 1.4–7.0)
Neutrophils: 40 %
Platelets: 165 10*3/uL (ref 150–450)
RBC: 5.1 x10E6/uL (ref 4.14–5.80)
RDW: 14 % (ref 11.6–15.4)
WBC: 3.7 10*3/uL (ref 3.4–10.8)

## 2021-07-25 LAB — COMPREHENSIVE METABOLIC PANEL
ALT: 38 IU/L (ref 0–44)
AST: 25 IU/L (ref 0–40)
Albumin/Globulin Ratio: 2.3 — ABNORMAL HIGH (ref 1.2–2.2)
Albumin: 4.6 g/dL (ref 3.7–4.7)
Alkaline Phosphatase: 79 IU/L (ref 44–121)
BUN/Creatinine Ratio: 17 (ref 10–24)
BUN: 15 mg/dL (ref 8–27)
Bilirubin Total: 1.3 mg/dL — ABNORMAL HIGH (ref 0.0–1.2)
CO2: 22 mmol/L (ref 20–29)
Calcium: 8.9 mg/dL (ref 8.6–10.2)
Chloride: 105 mmol/L (ref 96–106)
Creatinine, Ser: 0.86 mg/dL (ref 0.76–1.27)
Globulin, Total: 2 g/dL (ref 1.5–4.5)
Glucose: 115 mg/dL — ABNORMAL HIGH (ref 65–99)
Potassium: 4.1 mmol/L (ref 3.5–5.2)
Sodium: 141 mmol/L (ref 134–144)
Total Protein: 6.6 g/dL (ref 6.0–8.5)
eGFR: 93 mL/min/{1.73_m2} (ref >59)

## 2021-07-25 LAB — LIPID PANEL
Chol/HDL Ratio: 3.3 ratio (ref 0.0–5.0)
Cholesterol, Total: 170 mg/dL (ref 100–199)
HDL: 52 mg/dL (ref >39)
LDL Chol Calc (NIH): 101 mg/dL — ABNORMAL HIGH (ref 0–99)
Triglycerides: 93 mg/dL (ref 0–149)
VLDL Cholesterol Cal: 17 mg/dL (ref 5–40)

## 2021-07-25 LAB — HEMOGLOBIN A1C
Est. average glucose Bld gHb Est-mCnc: 123 mg/dL
Hgb A1c MFr Bld: 5.9 % — ABNORMAL HIGH (ref 4.8–5.6)

## 2021-07-25 LAB — TSH: TSH: 1.99 u[IU]/mL (ref 0.450–4.500)

## 2021-07-25 LAB — SARS-COV-2 ANTIBODIES: SARS-CoV-2 Antibodies: POSITIVE

## 2021-07-30 DIAGNOSIS — L309 Dermatitis, unspecified: Secondary | ICD-10-CM | POA: Diagnosis not present

## 2022-02-05 ENCOUNTER — Other Ambulatory Visit: Payer: Self-pay

## 2022-02-05 ENCOUNTER — Ambulatory Visit (INDEPENDENT_AMBULATORY_CARE_PROVIDER_SITE_OTHER): Payer: Medicare HMO | Admitting: Family Medicine

## 2022-02-05 ENCOUNTER — Encounter: Payer: Self-pay | Admitting: Family Medicine

## 2022-02-05 VITALS — BP 124/76 | HR 73 | Temp 98.0°F | Resp 16 | Ht 74.0 in | Wt 226.0 lb

## 2022-02-05 DIAGNOSIS — D500 Iron deficiency anemia secondary to blood loss (chronic): Secondary | ICD-10-CM

## 2022-02-05 DIAGNOSIS — R7303 Prediabetes: Secondary | ICD-10-CM | POA: Diagnosis not present

## 2022-02-05 DIAGNOSIS — G629 Polyneuropathy, unspecified: Secondary | ICD-10-CM | POA: Diagnosis not present

## 2022-02-05 DIAGNOSIS — K2270 Barrett's esophagus without dysplasia: Secondary | ICD-10-CM

## 2022-02-05 DIAGNOSIS — E559 Vitamin D deficiency, unspecified: Secondary | ICD-10-CM

## 2022-02-05 NOTE — Progress Notes (Signed)
?  ? ? ?Established patient visit ? ?I,[name],acting as a scribe for [name], MD.,have documented all relevant documentation on the behalf of [name], MD,as directed by  [name], MD while in the presence of [name], MD. ? ? ?Patient: Robert Fuller   DOB: 1951-10-14   71 y.o. Male  MRN: [id number] ?Visit Date: 02/05/2022 ? ?Today's healthcare provider: [name], MD  ? ?Chief Complaint  ?Patient presents with  ? Follow-up  ? Prediabetes  ? ?Subjective  ?  ?HPI  ?Patient comes in today for follow-up.  His neuropathic symptoms now seem to wake him up and seem to be getting little bit worse.  They did not used to bother him at night.  Overall he is feeling fairly well and is enjoying retirement. ?He requests referral back to GI for follow-up of Barrett's esophagus. ?Prediabetes, Follow-up ? ?Lab Results  ?Component Value Date  ? HGBA1C 5.9 (H) 07/24/2021  ? HGBA1C 5.9 (H) 07/23/2020  ? HGBA1C 5.6 01/17/2020  ? GLUCOSE 115 (H) 07/24/2021  ? GLUCOSE 110 (H) 07/23/2020  ? GLUCOSE 118 (H) 07/25/2019  ? ? ?Last seen for for this6 months ago.  ?Management since that visit includes; no changes. ?Current symptoms include none and have been unchanged. ? ?Prior visit with dietician: no ?Current diet: well balanced ?Current exercise: walking ? ?Pertinent Labs: ?   ?Component Value Date/Time  ? CHOL 170 07/24/2021 0839  ? TRIG 93 07/24/2021 0839  ? CHOLHDL 3.3 07/24/2021 0839  ? CREATININE 0.86 07/24/2021 0839  ? ? ?Wt Readings from Last 3 Encounters:  ?02/05/22 226 lb (102.5 kg)  ?07/21/21 228 lb (103.4 kg)  ?07/17/20 225 lb (102.1 kg)  ? ? ?----------------------------------------------------------------------------------------- ? ? ?Medications: ?Outpatient Medications Prior to Visit  ?Medication Sig  ? esomeprazole (NEXIUM) 20 MG capsule Take 20 mg by mouth daily at 12 noon.  ? SODIUM FLUORIDE 5000 PPM DT as needed.   ? ?No facility-administered medications prior to  visit.  ? ? ?Review of Systems  ?Constitutional:  Negative for appetite change, chills and fever.  ?Respiratory:  Negative for chest tightness, shortness of breath and wheezing.   ?Cardiovascular:  Negative for chest pain and palpitations.  ?Gastrointestinal:  Negative for abdominal pain, nausea and vomiting.  ? ?Last vitamin B12 and Folate ?No results found for: VITAMINB12, FOLATE ?  ?  Objective  ?  ?BP 124/76 (BP Location: Right Arm, Patient Position: Sitting, Cuff Size: Large)   Pulse 73   Temp 98 ?F (36.7 ?C) (Temporal)   Resp 16   Ht '6\' 2"'$  (1.88 m)   Wt 226 lb (102.5 kg)   SpO2 94%   BMI 29.02 kg/m?  ?BP Readings from Last 3 Encounters:  ?02/05/22 124/76  ?07/21/21 131/83  ?04/10/21 (!) 136/96  ? ?Wt Readings from Last 3 Encounters:  ?02/05/22 226 lb (102.5 kg)  ?07/21/21 228 lb (103.4 kg)  ?07/17/20 225 lb (102.1 kg)  ? ?  ? ?Physical Exam ?Vitals reviewed.  ?Constitutional:   ?   Appearance: Normal appearance.  ?HENT:  ?   Head: Normocephalic and atraumatic.  ?   Right Ear: External ear normal.  ?   Left Ear: External ear normal.  ?Eyes:  ?   General: No scleral icterus. ?   Conjunctiva/sclera: Conjunctivae normal.  ?Cardiovascular:  ?   Rate and Rhythm: Normal rate and regular rhythm.  ?   Heart sounds: Normal heart sounds.  ?Pulmonary:  ?  Breath sounds: Normal breath sounds.  ?Abdominal:  ?   Palpations: Abdomen is soft.  ?Musculoskeletal:  ?   Right lower leg: No edema.  ?   Left lower leg: No edema.  ?Skin: ?   General: Skin is warm and dry.  ?Neurological:  ?   General: No focal deficit present.  ?   Mental Status: He is alert and oriented to person, place, and time.  ?Psychiatric:     ?   Mood and Affect: Mood normal.     ?   Behavior: Behavior normal.     ?   Thought Content: Thought content normal.     ?   Judgment: Judgment normal.  ?  ? ? ?No results found for any visits on 02/05/22. ? Assessment & Plan  ?  ? ?1. Pre-diabetes ?Could contribute to neuropathy if this progresses ?- POCT  glycosylated hemoglobin (Hb A1C) ?- HgB A1c ? ?2. Iron deficiency anemia due to chronic blood loss ?Try to do A1c in the office but it showed that he was anemic ?- CBC w/Diff/Platelet ? ?3. Barrett's esophagus without dysplasia ?Refer back to GI for follow-up and possible follow-up endoscopy ?- CBC w/Diff/Platelet ?- Ambulatory referral to Gastroenterology ? ?4. Avitaminosis D ? ? ?5. Neuropathy ?Patient with neuropathic symptoms for which he has seen neurology in the past.  Refer back to them if he seems to be having worse symptoms.  And now interrupts his sleep.  Consider gabapentin ?- Ambulatory referral to Neurology ? ? ?Return in about 6 months (around 08/08/2022).  ?   ? ?I, [name], MD, have reviewed all documentation for this visit. The documentation on 02/09/22 for the exam, diagnosis, procedures, and orders are all accurate and complete. ? ? ? ?Monteen Toops Cranford Mon, MD  ?[facility] ?[phone] (phone) ?[phone] (fax) ? ?[facility] ?

## 2022-02-06 DIAGNOSIS — D500 Iron deficiency anemia secondary to blood loss (chronic): Secondary | ICD-10-CM | POA: Diagnosis not present

## 2022-02-06 DIAGNOSIS — R7303 Prediabetes: Secondary | ICD-10-CM | POA: Diagnosis not present

## 2022-02-06 DIAGNOSIS — K2270 Barrett's esophagus without dysplasia: Secondary | ICD-10-CM | POA: Diagnosis not present

## 2022-02-07 LAB — CBC WITH DIFFERENTIAL/PLATELET
Basophils Absolute: 0.1 10*3/uL (ref 0.0–0.2)
Basos: 2 %
EOS (ABSOLUTE): 0.1 10*3/uL (ref 0.0–0.4)
Eos: 3 %
Hematocrit: 47.3 % (ref 37.5–51.0)
Hemoglobin: 16.4 g/dL (ref 13.0–17.7)
Immature Grans (Abs): 0 10*3/uL (ref 0.0–0.1)
Immature Granulocytes: 0 %
Lymphocytes Absolute: 1.6 10*3/uL (ref 0.7–3.1)
Lymphs: 39 %
MCH: 32.5 pg (ref 26.6–33.0)
MCHC: 34.7 g/dL (ref 31.5–35.7)
MCV: 94 fL (ref 79–97)
Monocytes Absolute: 0.6 10*3/uL (ref 0.1–0.9)
Monocytes: 14 %
Neutrophils Absolute: 1.8 10*3/uL (ref 1.4–7.0)
Neutrophils: 42 %
Platelets: 176 10*3/uL (ref 150–450)
RBC: 5.05 x10E6/uL (ref 4.14–5.80)
RDW: 13.5 % (ref 11.6–15.4)
WBC: 4.1 10*3/uL (ref 3.4–10.8)

## 2022-02-07 LAB — HEMOGLOBIN A1C
Est. average glucose Bld gHb Est-mCnc: 126 mg/dL
Hgb A1c MFr Bld: 6 % — ABNORMAL HIGH (ref 4.8–5.6)

## 2022-02-12 DIAGNOSIS — L237 Allergic contact dermatitis due to plants, except food: Secondary | ICD-10-CM | POA: Diagnosis not present

## 2022-04-16 DIAGNOSIS — D225 Melanocytic nevi of trunk: Secondary | ICD-10-CM | POA: Diagnosis not present

## 2022-04-16 DIAGNOSIS — D2261 Melanocytic nevi of right upper limb, including shoulder: Secondary | ICD-10-CM | POA: Diagnosis not present

## 2022-04-16 DIAGNOSIS — D2262 Melanocytic nevi of left upper limb, including shoulder: Secondary | ICD-10-CM | POA: Diagnosis not present

## 2022-04-16 DIAGNOSIS — L821 Other seborrheic keratosis: Secondary | ICD-10-CM | POA: Diagnosis not present

## 2022-04-28 DIAGNOSIS — K2270 Barrett's esophagus without dysplasia: Secondary | ICD-10-CM | POA: Diagnosis not present

## 2022-04-28 DIAGNOSIS — Z86010 Personal history of colonic polyps: Secondary | ICD-10-CM | POA: Diagnosis not present

## 2022-05-11 DIAGNOSIS — G20 Parkinson's disease: Secondary | ICD-10-CM | POA: Diagnosis not present

## 2022-05-11 DIAGNOSIS — Z79899 Other long term (current) drug therapy: Secondary | ICD-10-CM | POA: Diagnosis not present

## 2022-05-11 DIAGNOSIS — M720 Palmar fascial fibromatosis [Dupuytren]: Secondary | ICD-10-CM | POA: Diagnosis not present

## 2022-05-11 DIAGNOSIS — E559 Vitamin D deficiency, unspecified: Secondary | ICD-10-CM | POA: Diagnosis not present

## 2022-05-11 DIAGNOSIS — R0683 Snoring: Secondary | ICD-10-CM | POA: Diagnosis not present

## 2022-05-11 DIAGNOSIS — M25512 Pain in left shoulder: Secondary | ICD-10-CM | POA: Diagnosis not present

## 2022-05-11 DIAGNOSIS — M25511 Pain in right shoulder: Secondary | ICD-10-CM | POA: Diagnosis not present

## 2022-05-11 DIAGNOSIS — G8929 Other chronic pain: Secondary | ICD-10-CM | POA: Diagnosis not present

## 2022-05-11 DIAGNOSIS — H02401 Unspecified ptosis of right eyelid: Secondary | ICD-10-CM | POA: Diagnosis not present

## 2022-05-11 DIAGNOSIS — G5603 Carpal tunnel syndrome, bilateral upper limbs: Secondary | ICD-10-CM | POA: Diagnosis not present

## 2022-05-12 ENCOUNTER — Other Ambulatory Visit: Payer: Self-pay | Admitting: Neurology

## 2022-05-12 DIAGNOSIS — G20 Parkinson's disease: Secondary | ICD-10-CM

## 2022-05-21 ENCOUNTER — Ambulatory Visit: Payer: Medicare HMO

## 2022-05-29 DIAGNOSIS — S91311A Laceration without foreign body, right foot, initial encounter: Secondary | ICD-10-CM | POA: Diagnosis not present

## 2022-05-29 DIAGNOSIS — M79671 Pain in right foot: Secondary | ICD-10-CM | POA: Diagnosis not present

## 2022-06-08 ENCOUNTER — Ambulatory Visit
Admission: RE | Admit: 2022-06-08 | Discharge: 2022-06-08 | Disposition: A | Discharge status: Home / Self Care | Payer: Medicare HMO | Source: Ambulatory Visit | Attending: Neurology | Admitting: Neurology

## 2022-06-08 DIAGNOSIS — G20 Parkinson's disease: Secondary | ICD-10-CM | POA: Insufficient documentation

## 2022-06-08 DIAGNOSIS — J3489 Other specified disorders of nose and nasal sinuses: Secondary | ICD-10-CM | POA: Diagnosis not present

## 2022-06-08 DIAGNOSIS — I6782 Cerebral ischemia: Secondary | ICD-10-CM | POA: Diagnosis not present

## 2022-07-13 DIAGNOSIS — H2513 Age-related nuclear cataract, bilateral: Secondary | ICD-10-CM | POA: Diagnosis not present

## 2022-07-13 DIAGNOSIS — H353131 Nonexudative age-related macular degeneration, bilateral, early dry stage: Secondary | ICD-10-CM | POA: Diagnosis not present

## 2022-07-21 ENCOUNTER — Encounter
Admission: RE | Disposition: A | Discharge status: Home / Self Care | Payer: Self-pay | Source: Ambulatory Visit | Attending: Gastroenterology

## 2022-07-21 ENCOUNTER — Ambulatory Visit: Payer: Medicare HMO | Admitting: Anesthesiology

## 2022-07-21 ENCOUNTER — Encounter: Payer: Self-pay | Admitting: *Deleted

## 2022-07-21 ENCOUNTER — Ambulatory Visit
Admission: RE | Admit: 2022-07-21 | Discharge: 2022-07-21 | Disposition: A | Discharge status: Home / Self Care | Payer: Medicare HMO | Source: Ambulatory Visit | Attending: Gastroenterology | Admitting: Gastroenterology

## 2022-07-21 DIAGNOSIS — K219 Gastro-esophageal reflux disease without esophagitis: Secondary | ICD-10-CM | POA: Diagnosis not present

## 2022-07-21 DIAGNOSIS — Z1211 Encounter for screening for malignant neoplasm of colon: Secondary | ICD-10-CM | POA: Insufficient documentation

## 2022-07-21 DIAGNOSIS — Z86010 Personal history of colonic polyps: Secondary | ICD-10-CM | POA: Insufficient documentation

## 2022-07-21 DIAGNOSIS — K317 Polyp of stomach and duodenum: Secondary | ICD-10-CM | POA: Diagnosis not present

## 2022-07-21 DIAGNOSIS — G20 Parkinson's disease: Secondary | ICD-10-CM | POA: Insufficient documentation

## 2022-07-21 DIAGNOSIS — K449 Diaphragmatic hernia without obstruction or gangrene: Secondary | ICD-10-CM | POA: Insufficient documentation

## 2022-07-21 DIAGNOSIS — Z87442 Personal history of urinary calculi: Secondary | ICD-10-CM | POA: Diagnosis not present

## 2022-07-21 DIAGNOSIS — R7303 Prediabetes: Secondary | ICD-10-CM | POA: Insufficient documentation

## 2022-07-21 DIAGNOSIS — D123 Benign neoplasm of transverse colon: Secondary | ICD-10-CM | POA: Diagnosis not present

## 2022-07-21 DIAGNOSIS — G629 Polyneuropathy, unspecified: Secondary | ICD-10-CM | POA: Insufficient documentation

## 2022-07-21 DIAGNOSIS — D122 Benign neoplasm of ascending colon: Secondary | ICD-10-CM | POA: Insufficient documentation

## 2022-07-21 DIAGNOSIS — K635 Polyp of colon: Secondary | ICD-10-CM | POA: Diagnosis not present

## 2022-07-21 DIAGNOSIS — K2270 Barrett's esophagus without dysplasia: Secondary | ICD-10-CM | POA: Insufficient documentation

## 2022-07-21 HISTORY — PX: ESOPHAGOGASTRODUODENOSCOPY (EGD) WITH PROPOFOL: SHX5813

## 2022-07-21 HISTORY — PX: COLONOSCOPY WITH PROPOFOL: SHX5780

## 2022-07-21 SURGERY — COLONOSCOPY WITH PROPOFOL
Anesthesia: General

## 2022-07-21 MED ORDER — SODIUM CHLORIDE 0.9 % IV SOLN: INTRAVENOUS | Status: DC

## 2022-07-21 MED ORDER — MIDAZOLAM HCL 2 MG/2ML IJ SOLN
INTRAMUSCULAR | Status: AC
  Filled 2022-07-21: qty 2

## 2022-07-21 MED ORDER — MIDAZOLAM HCL 2 MG/2ML IJ SOLN
INTRAMUSCULAR | Status: DC | PRN
  Administered 2022-07-21: 2 mg via INTRAVENOUS

## 2022-07-21 MED ORDER — PROPOFOL 10 MG/ML IV BOLUS
INTRAVENOUS | Status: DC | PRN
  Administered 2022-07-21: 40 mg via INTRAVENOUS

## 2022-07-21 MED ORDER — PROPOFOL 500 MG/50ML IV EMUL
INTRAVENOUS | Status: DC | PRN
  Administered 2022-07-21: 100 ug/kg/min via INTRAVENOUS

## 2022-07-21 MED ORDER — KETAMINE HCL 50 MG/5ML IJ SOSY
PREFILLED_SYRINGE | INTRAMUSCULAR | Status: AC
  Filled 2022-07-21: qty 5

## 2022-07-21 MED ORDER — PROPOFOL 1000 MG/100ML IV EMUL
INTRAVENOUS | Status: AC
  Filled 2022-07-21: qty 100

## 2022-07-21 MED ORDER — SODIUM CHLORIDE 0.9 % IV SOLN: INTRAVENOUS | Status: DC | PRN

## 2022-07-21 MED ORDER — KETAMINE HCL 10 MG/ML IJ SOLN
INTRAMUSCULAR | Status: DC | PRN
  Administered 2022-07-21: 20 mg via INTRAVENOUS

## 2022-07-21 MED ORDER — GLYCOPYRROLATE 0.2 MG/ML IJ SOLN
INTRAMUSCULAR | Status: DC | PRN
  Administered 2022-07-21: .2 mg via INTRAVENOUS

## 2022-07-21 NOTE — H&P (Signed)
Outpatient short stay form Pre-procedure 07/21/2022  [name], MD  Primary Physician: Jerrol Banana., MD  Reason for visit:  GERD/Surveillance  History of present illness:    71 y/o gentleman with history of BE without dysplasia here for EGD and colonoscopy for 4 Ta's on last colonoscopy in 2019. No significant abdominal surgeries. No blood thinners. No family history of GI malignancies.    Current Facility-Administered Medications:    0.9 %  sodium chloride infusion, , Intravenous, Continuous, Tamura Lasky, Hilton Cork, MD, Last Rate: 20 mL/hr at 07/21/22 0845, New Bag at 07/21/22 0845  Medications Prior to Admission  Medication Sig Dispense Refill Last Dose   esomeprazole (NEXIUM) 20 MG capsule Take 20 mg by mouth daily at 12 noon.   07/20/2022   SODIUM FLUORIDE 5000 PPM DT as needed.         Allergies  Allergen Reactions   Cefprozil    Ketorolac Tromethamine    Morphine    Sulfa Antibiotics     Patient reports severe reactions depending on the strength.     Past Medical History:  Diagnosis Date   Barrett's esophagus    Cough    Esophageal reflux    Kidney stone     Review of systems:  Otherwise negative.    Physical Exam  Gen: Alert, oriented. Appears stated age.  HEENT: PERRLA. Lungs: No respiratory distress CV: RRR Abd: soft, benign, no masses Ext: No edema    Planned procedures: Proceed with EGD/colonoscopy. The patient understands the nature of the planned procedure, indications, risks, alternatives and potential complications including but not limited to bleeding, infection, perforation, damage to internal organs and possible oversedation/side effects from anesthesia. The patient agrees and gives consent to proceed.  Please refer to procedure notes for findings, recommendations and patient disposition/instructions.     [name], MD [facility] Gastroenterology

## 2022-07-21 NOTE — Interval H&P Note (Signed)
History and Physical Interval Note:  07/21/2022 9:04 AM  Robert Fuller  has presented today for surgery, with the diagnosis of BARRETT'S ESOPHAGUS, HX OF ADENOMATOUS POLYP OF COLON,.  The various methods of treatment have been discussed with the patient and family. After consideration of risks, benefits and other options for treatment, the patient has consented to  Procedure(s): COLONOSCOPY WITH PROPOFOL (N/A) ESOPHAGOGASTRODUODENOSCOPY (EGD) WITH PROPOFOL (N/A) as a surgical intervention.  The patient's history has been reviewed, patient examined, no change in status, stable for surgery.  I have reviewed the patient's chart and labs.  Questions were answered to the patient's satisfaction.     [name]  Ok to proceed with colonoscopy

## 2022-07-21 NOTE — Interval H&P Note (Signed)
History and Physical Interval Note:  07/21/2022 9:04 AM  Robert Fuller  has presented today for surgery, with the diagnosis of BARRETT'S ESOPHAGUS, HX OF ADENOMATOUS POLYP OF COLON,.  The various methods of treatment have been discussed with the patient and family. After consideration of risks, benefits and other options for treatment, the patient has consented to  Procedure(s): COLONOSCOPY WITH PROPOFOL (N/A) ESOPHAGOGASTRODUODENOSCOPY (EGD) WITH PROPOFOL (N/A) as a surgical intervention.  The patient's history has been reviewed, patient examined, no change in status, stable for surgery.  I have reviewed the patient's chart and labs.  Questions were answered to the patient's satisfaction.     [name]  Ok to proceed with EGD and colonoscopy

## 2022-07-21 NOTE — Progress Notes (Unsigned)
Annual Wellness Visit     Patient: Robert Fuller, Male    DOB: Jun 16, 1951, 71 y.o.   MRN: [id number] Visit Date: 07/22/2022  Today's Provider: [name], MD   No chief complaint on file.  Subjective    Robert Fuller is a 71 y.o. male who presents today for his Annual Wellness Visit. He reports consuming a {diet types:17450} diet. {Exercise:19826} He generally feels {well/fairly well/poorly:18703}. He reports sleeping {well/fairly well/poorly:18703}. He {does/does not:200015} have additional problems to discuss today.   HPI    Medications: Facility-Administered Medications Prior to Visit  Medication Dose Route Frequency Provider   0.9 %  sodium chloride infusion   Intravenous Continuous Locklear, Hilton Cork, MD   Outpatient Medications Prior to Visit  Medication Sig   esomeprazole (NEXIUM) 20 MG capsule Take 20 mg by mouth daily at 12 noon.   SODIUM FLUORIDE 5000 PPM DT as needed.     Allergies  Allergen Reactions   Cefprozil    Ketorolac Tromethamine    Morphine    Sulfa Antibiotics     Patient reports severe reactions depending on the strength.    Patient Care Team: Robert Banana., MD as PCP - General (Family Medicine) Robert Rack, MD (Dermatology) Robert Crofts, MD as Consulting Physician (Neurology)  Review of Systems  All other systems reviewed and are negative.   {Labs  Heme  Chem  Endocrine  Serology  Results Review (optional):23779}    Objective    Vitals: There were no vitals taken for this visit. {Show previous vital signs (optional):23777}   Physical Exam ***  Most recent functional status assessment:    02/05/2022    3:55 PM  In your present state of health, do you have any difficulty performing the following activities:  Hearing? 1  Vision? 1  Difficulty concentrating or making decisions? 0  Walking or climbing stairs? 0  Dressing or bathing? 0   Most recent fall risk assessment:    02/05/2022    3:54 PM   [place] in the past year? 0  Number falls in past yr: 0  Injury with Fall? 0  Risk for fall due to : No Fall Risks  Follow up Falls evaluation completed    Most recent depression screenings:    02/05/2022    3:55 PM 07/21/2021    2:12 PM  PHQ 2/9 Scores  PHQ - 2 Score 1 0  PHQ- 9 Score 2    Most recent cognitive screening:    07/10/2020   10:12 AM  6CIT Screen  What Year? 0 points  What month? 0 points  What time? 0 points  Count back from 20 0 points  Months in reverse 0 points  Repeat phrase 0 points  Total Score 0 points   Most recent Audit-C alcohol use screening    02/05/2022    3:54 PM  Alcohol Use Disorder Test (AUDIT)  1. How often do you have a drink containing alcohol? 3  2. How many drinks containing alcohol do you have on a typical day when you are drinking? 0  3. How often do you have six or more drinks on one occasion? 0  AUDIT-C Score 3  4. How often during the last year have you found that you were not able to stop drinking once you had started? 0  5. How often during the last year have you failed to do what was normally expected from  you because of drinking? 0  6. How often during the last year have you needed a first drink in the morning to get yourself going after a heavy drinking session? 0  7. How often during the last year have you had a feeling of guilt of remorse after drinking? 0  8. How often during the last year have you been unable to remember what happened the night before because you had been drinking? 0  9. Have you or someone else been injured as a result of your drinking? 0  10. Has a relative or friend or a doctor or another health worker been concerned about your drinking or suggested you cut down? 0  Alcohol Use Disorder Identification Test Final Score (AUDIT) 3   A score of 3 or more in women, and 4 or more in men indicates increased risk for alcohol abuse, EXCEPT if all of the points are from question 1   No results found  for any visits on 07/22/22.  Assessment & Plan     Annual wellness visit done today including the all of the following: Reviewed patient's Family Medical History Reviewed and updated list of patient's medical providers Assessment of cognitive impairment was done Assessed patient's functional ability Established a written schedule for health screening [place] Completed and Reviewed  Exercise Activities and Dietary recommendations  Goals      Care Management     Current Barriers:  Care Coordination needs related to prediabetes.  (Hx of GERD)  Case Manager Clinical Goal(s):  Over the next 90 days, patient will continue to adhere to providers diet and treatment recommendations r/t prediabetes. Over the next 90 days, patient will take all medications as prescribed. Over the next 90 days, patient will attend all scheduled medical appointments.  Interventions:  Reviewed medications. Encouraged to take medications as prescribed. Encouraged to notify care management team with concerns regarding prescription costs. Discussed compliance with provider's recommendations and nutritional intake.  Reviewed medical appointments. Encouraged to attend appointments as scheduled to prevent delays in care. Denies concerns regarding transportation. Reports pending appointment with Dr. Alice Reichert (Gastroenterology) on 02/08/20.   Patient Self Care Activities:  Self administers medications  Attends scheduled provider appointments Calls pharmacy for medication refills Attends church or other social activities Performs ADL's independently Performs IADL's independently Calls provider office for new concerns or questions  Please see past updates related to this goal by clicking on the "Past Updates" button in the selected goal       DIET - REDUCE CALORIE INTAKE     Continue current diet plan of cutting out all white foods in diet to aid with weight loss.         Immunization  History  Administered Date(s) Administered   Moderna Sars-Covid-2 Vaccination 01/04/2020, 01/31/2020   Pneumococcal Conjugate-13 12/29/2017   Pneumococcal Polysaccharide-23 07/18/2019   Rabies, IM 05/05/2016, 05/08/2016, 05/12/2016   Tdap 07/06/2012   Zoster, Live 07/06/2012    Health Maintenance  Topic Date Due   Zoster Vaccines- Shingrix (1 of 2) Never done   COVID-19 Vaccine (3 - Moderna risk series) 02/28/2020   COLONOSCOPY (Pts 45-53yr Insurance coverage will need to be confirmed)  03/16/2021   TETANUS/TDAP  07/06/2022   INFLUENZA VACCINE  06/30/2022   Pneumonia Vaccine 71 Years old  Completed   Hepatitis C Screening  Completed   HPV VACCINES  Aged Out     Discussed health benefits of physical activity, and encouraged him to engage in regular exercise  appropriate for his age and condition.    ***  No follow-ups on file.     {provider attestation***:1}   [name], MD  [facility] [phone] (phone) [phone] (fax)  [place]

## 2022-07-21 NOTE — Anesthesia Postprocedure Evaluation (Signed)
Anesthesia Post Note  Patient: Robert Fuller  Procedure(s) Performed: COLONOSCOPY WITH PROPOFOL ESOPHAGOGASTRODUODENOSCOPY (EGD) WITH PROPOFOL  Patient location during evaluation: PACU Anesthesia Type: General Level of consciousness: awake and alert, oriented and patient cooperative Pain management: pain level controlled Vital Signs Assessment: post-procedure vital signs reviewed and stable Respiratory status: spontaneous breathing, nonlabored ventilation and respiratory function stable Cardiovascular status: blood pressure returned to baseline and stable Postop Assessment: adequate PO intake Anesthetic complications: no   No notable events documented.   Last Vitals:  Vitals:   07/21/22 0952 07/21/22 1002  BP: 124/85 (!) 132/94  Pulse: 62 (!) 56  Resp: 18 14  Temp:    SpO2: 95% 98%    Last Pain:  Vitals:   07/21/22 1002  TempSrc:   PainSc: 0-No pain                 Darrin Nipper

## 2022-07-21 NOTE — Anesthesia Preprocedure Evaluation (Addendum)
Anesthesia Evaluation  Patient identified by MRN, date of birth, ID band Patient awake    Reviewed: Allergy & Precautions, NPO status , Patient's Chart, lab work & pertinent test results  History of Anesthesia Complications Negative for: history of anesthetic complications  Airway Mallampati: I   Neck ROM: Full    Dental no notable dental hx.    Pulmonary neg pulmonary ROS,    Pulmonary exam normal breath sounds clear to auscultation       Cardiovascular Exercise Tolerance: Good negative cardio ROS Normal cardiovascular exam Rhythm:Regular Rate:Normal     Neuro/Psych  Neuromuscular disease (Parkinson disease; neuropathy)    GI/Hepatic GERD (Barrett esophagus)  ,  Endo/Other  Prediabetes   Renal/GU Renal disease (nephrolithiasis)     Musculoskeletal   Abdominal   Peds  Hematology negative hematology ROS (+)   Anesthesia Other Findings   Reproductive/Obstetrics                            Anesthesia Physical Anesthesia Plan  ASA: 2  Anesthesia Plan: General   Post-op Pain Management:    Induction: Intravenous  PONV Risk Score and Plan: 2 and Propofol infusion, TIVA and Treatment may vary due to age or medical condition  Airway Management Planned: Natural Airway  Additional Equipment:   Intra-op Plan:   Post-operative Plan:   Informed Consent: I have reviewed the patients History and Physical, chart, labs and discussed the procedure including the risks, benefits and alternatives for the proposed anesthesia with the patient or authorized representative who has indicated his/her understanding and acceptance.       Plan Discussed with: CRNA  Anesthesia Plan Comments: (LMA/GETA backup discussed.  Patient consented for risks of anesthesia including but not limited to:  - adverse reactions to medications - damage to eyes, teeth, lips or other oral mucosa - nerve damage due to  positioning  - sore throat or hoarseness - damage to heart, brain, nerves, lungs, other parts of body or loss of life  Informed patient about role of CRNA in peri- and intra-operative care.  Patient voiced understanding.)        Anesthesia Quick Evaluation

## 2022-07-21 NOTE — Transfer of Care (Addendum)
Immediate Anesthesia Transfer of Care Note  Patient: Robert Fuller  Procedure(s) Performed: COLONOSCOPY WITH PROPOFOL ESOPHAGOGASTRODUODENOSCOPY (EGD) WITH PROPOFOL  Patient Location: PACU and Endoscopy Unit  Anesthesia Type:MAC  Level of Consciousness: drowsy  Airway & Oxygen Therapy: Patient Spontanous Breathing  Post-op Assessment: Report given to RN and Post -op Vital signs reviewed and stable  Post vital signs: Reviewed and stable  Last Vitals:  Vitals Value Taken Time  BP    Temp    Pulse    Resp    SpO2      Last Pain:  Vitals:   07/21/22 0829  TempSrc: Temporal  PainSc: 0-No pain         Complications: No notable events documented.

## 2022-07-21 NOTE — Op Note (Signed)
[facility] Gastroenterology Patient Name: Robert Fuller Procedure Date: 07/21/2022 8:52 AM MRN: [id number] Account #: [id number] Date of Birth: July 10, 1951 Admit Type: Outpatient Age: 71 Room: [facility] ENDO ROOM 1 Gender: Male Note Status: Finalized Instrument Name: Park Meo [id number] Procedure:             Colonoscopy Indications:           Surveillance: Personal history of adenomatous polyps                         on last colonoscopy > 3 years ago Providers:             Andrey Farmer MD, MD Referring MD:          Janine Ores. Rosanna Randy, MD (Referring MD) Medicines:             Monitored Anesthesia Care Complications:         No immediate complications. Estimated blood loss:                         Minimal. Procedure:             Pre-Anesthesia Assessment:                        - Prior to the procedure, a History and Physical was                         performed, and patient medications and allergies were                         reviewed. The patient is competent. The risks and                         benefits of the procedure and the sedation options and                         risks were discussed with the patient. All questions                         were answered and informed consent was obtained.                         Patient identification and proposed procedure were                         verified by the physician, the nurse, the                         anesthesiologist, the anesthetist and the technician                         in the endoscopy suite. Mental Status Examination:                         alert and oriented. Airway Examination: normal                         oropharyngeal airway and neck mobility. Respiratory  Examination: clear to auscultation. CV Examination:                         normal. Prophylactic Antibiotics: The patient does not                         require prophylactic antibiotics. Prior                          Anticoagulants: The patient has taken no previous                         anticoagulant or antiplatelet agents. ASA Grade                         Assessment: II - A patient with mild systemic disease.                         After reviewing the risks and benefits, the patient                         was deemed in satisfactory condition to undergo the                         procedure. The anesthesia plan was to use monitored                         anesthesia care (MAC). Immediately prior to                         administration of medications, the patient was                         re-assessed for adequacy to receive sedatives. The                         heart rate, respiratory rate, oxygen saturations,                         blood pressure, adequacy of pulmonary ventilation, and                         response to care were monitored throughout the                         procedure. The physical status of the patient was                         re-assessed after the procedure.                        After obtaining informed consent, the colonoscope was                         passed under direct vision. Throughout the procedure,                         the patient's blood pressure, pulse, and oxygen  saturations were monitored continuously. The                         Colonoscope was introduced through the anus and                         advanced to the the cecum, identified by appendiceal                         orifice and ileocecal valve. The colonoscopy was                         performed without difficulty. The patient tolerated                         the procedure well. The quality of the bowel                         preparation was good. Findings:      The perianal and digital rectal examinations were normal.      A 1 mm polyp was found in the ascending colon. The polyp was sessile.       The polyp was removed with a jumbo cold forceps.  Resection and retrieval       were complete. Estimated blood loss was minimal.      A 3 mm polyp was found in the ascending colon. The polyp was sessile.       The polyp was removed with a cold snare. Resection and retrieval were       complete. Estimated blood loss was minimal.      A 3 mm polyp was found in the transverse colon. The polyp was sessile.       The polyp was removed with a cold snare. Resection and retrieval were       complete. Estimated blood loss was minimal.      The exam was otherwise without abnormality on direct and retroflexion       views. Impression:            - One 1 mm polyp in the ascending colon, removed with                         a jumbo cold forceps. Resected and retrieved.                        - One 3 mm polyp in the ascending colon, removed with                         a cold snare. Resected and retrieved.                        - One 3 mm polyp in the transverse colon, removed with                         a cold snare. Resected and retrieved.                        - The examination was otherwise normal on direct and  retroflexion views. Recommendation:        - Discharge patient to home.                        - Resume previous diet.                        - Continue present medications.                        - Await pathology results.                        - Repeat colonoscopy in 3 years for surveillance.                        - Return to referring physician as previously                         scheduled. Procedure Code(s):     --- Professional ---                        [id number], Colonoscopy, flexible; with removal of                         tumor(s), polyp(s), or other lesion(s) by snare                         technique                        45380, 97, Colonoscopy, flexible; with biopsy, single                         or multiple Diagnosis Code(s):     --- Professional ---                        Z86.010, Personal  history of colonic polyps                        K63.5, Polyp of colon CPT copyright 2019 [employer]. All rights reserved. The codes documented in this report are preliminary and upon coder review may  be revised to meet current compliance requirements. Andrey Farmer MD, MD 07/21/2022 9:47:11 AM Number of Addenda: 0 Note Initiated On: 07/21/2022 8:52 AM Scope Withdrawal Time: 0 hours 10 minutes 26 seconds  Total Procedure Duration: 0 hours 15 minutes 55 seconds  Estimated Blood Loss:  Estimated blood loss was minimal.      [facility]

## 2022-07-21 NOTE — Op Note (Signed)
[facility] Gastroenterology Patient Name: Robert Fuller Procedure Date: 07/21/2022 8:53 AM MRN: [id number] Account #: [id number] Date of Birth: [birth year] Admit Type: Outpatient Age: 71 Room: [facility] ENDO ROOM 1 Gender: Male Note Status: Finalized Instrument Name: Upper Endoscope [id number] Procedure:             Upper GI endoscopy Indications:           Gastro-esophageal reflux disease Providers:             Andrey Farmer MD, MD Referring MD:          Janine Ores. Rosanna Randy, MD (Referring MD) Medicines:             Monitored Anesthesia Care Complications:         No immediate complications. Estimated blood loss:                         Minimal. Procedure:             Pre-Anesthesia Assessment:                        - Prior to the procedure, a History and Physical was                         performed, and patient medications and allergies were                         reviewed. The patient is competent. The risks and                         benefits of the procedure and the sedation options and                         risks were discussed with the patient. All questions                         were answered and informed consent was obtained.                         Patient identification and proposed procedure were                         verified by the physician, the nurse, the                         anesthesiologist, the anesthetist and the technician                         in the endoscopy suite. Mental Status Examination:                         alert and oriented. Airway Examination: normal                         oropharyngeal airway and neck mobility. Respiratory                         Examination: clear to auscultation. CV Examination:  normal. Prophylactic Antibiotics: The patient does not                         require prophylactic antibiotics. Prior                         Anticoagulants: The patient has taken no previous                          anticoagulant or antiplatelet agents. ASA Grade                         Assessment: II - A patient with mild systemic disease.                         After reviewing the risks and benefits, the patient                         was deemed in satisfactory condition to undergo the                         procedure. The anesthesia plan was to use monitored                         anesthesia care (MAC). Immediately prior to                         administration of medications, the patient was                         re-assessed for adequacy to receive sedatives. The                         heart rate, respiratory rate, oxygen saturations,                         blood pressure, adequacy of pulmonary ventilation, and                         response to care were monitored throughout the                         procedure. The physical status of the patient was                         re-assessed after the procedure.                        After obtaining informed consent, the endoscope was                         passed under direct vision. Throughout the procedure,                         the patient's blood pressure, pulse, and oxygen                         saturations were monitored continuously. The Endoscope  was introduced through the mouth, and advanced to the                         second part of duodenum. The upper GI endoscopy was                         accomplished without difficulty. The patient tolerated                         the procedure well. Findings:      The esophagus and gastroesophageal junction were examined with white       light and narrow band imaging (NBI). There were esophageal mucosal       changes classified as Barrett's stage C2-M2 per Prague criteria. These       changes involved the mucosa at the upper extent of the gastric folds (37       cm from the incisors) extending to the Z-line (39 cm from the incisors).        Circumferential salmon-colored mucosa was present. The maximum       longitudinal extent of these esophageal mucosal changes was 2 cm in       length. Mucosa was biopsied with a cold forceps for histology in 4       quadrants at intervals of 2 cm. A total of 2 specimen bottles were sent       to pathology. Estimated blood loss was minimal.      A 3 cm hiatal hernia was present.      Multiple pedunculated and sessile fundic gland polyps with no bleeding       and no stigmata of recent bleeding were found in the stomach.      The exam of the stomach was otherwise normal.      The examined duodenum was normal. Impression:            - Esophageal mucosal changes classified as Barrett's                         stage C2-M2 per Prague criteria. Biopsied.                        - 3 cm hiatal hernia.                        - Multiple fundic gland polyps.                        - Normal examined duodenum. Recommendation:        - Discharge patient to home.                        - Resume previous diet.                        - Continue present medications.                        - Await pathology results.                        - Return to referring physician as previously  scheduled. Procedure Code(s):     --- Professional ---                        [phone], Esophagogastroduodenoscopy, flexible,                         transoral; with biopsy, single or multiple Diagnosis Code(s):     --- Professional ---                        K22.70, Barrett's esophagus without dysplasia                        K44.9, Diaphragmatic hernia without obstruction or                         gangrene                        K31.7, Polyp of stomach and duodenum                        K21.9, Gastro-esophageal reflux disease without                         esophagitis CPT copyright 2019 American [employer] Association. All rights reserved. The codes documented in this report are preliminary and upon  coder review may  be revised to meet current compliance requirements. Andrey Farmer MD, MD 07/21/2022 9:44:18 AM Number of Addenda: 0 Note Initiated On: 07/21/2022 8:53 AM Estimated Blood Loss:  Estimated blood loss was minimal.      [facility]

## 2022-07-22 ENCOUNTER — Encounter: Payer: Self-pay | Admitting: Gastroenterology

## 2022-07-22 ENCOUNTER — Encounter: Payer: Medicare HMO | Admitting: Family Medicine

## 2022-07-22 DIAGNOSIS — Z0000 Encounter for general adult medical examination without abnormal findings: Secondary | ICD-10-CM

## 2022-07-22 DIAGNOSIS — R7303 Prediabetes: Secondary | ICD-10-CM

## 2022-07-22 DIAGNOSIS — D500 Iron deficiency anemia secondary to blood loss (chronic): Secondary | ICD-10-CM

## 2022-07-22 DIAGNOSIS — Z13228 Encounter for screening for other metabolic disorders: Secondary | ICD-10-CM

## 2022-07-22 DIAGNOSIS — K2100 Gastro-esophageal reflux disease with esophagitis, without bleeding: Secondary | ICD-10-CM

## 2022-07-22 LAB — SURGICAL PATHOLOGY

## 2022-07-24 NOTE — Progress Notes (Unsigned)
Annual Wellness Visit    I,Robert Fuller,acting as a scribe for Robert Durie, MD.,have documented all relevant documentation on the behalf of Robert Durie, MD,as directed by  Robert Durie, MD while in the presence of Robert Durie, MD.   Patient: Robert Fuller, Male    DOB: October 11, 1951, 71 y.o.   MRN: [id number] Visit Date: 07/27/2022  Today's Provider: [name], MD   Chief Complaint  Patient presents with   Medicare Wellness   Subjective    Robert Fuller is a 71 y.o. male who presents today for his Annual Wellness Visit.Annual physical. He reports consuming a general and low sodium diet. Home exercise routine includes stretching and squats. He generally feels well. He reports sleeping fairly well. He does not have additional problems to discuss today.  He is married, father of 2 daughters 2 stepsons and 5 grandchildren. HPI    Medications: Outpatient Medications Prior to Visit  Medication Sig   esomeprazole (NEXIUM) 20 MG capsule Take 20 mg by mouth daily at 12 noon.   SODIUM FLUORIDE 5000 PPM DT as needed.    No facility-administered medications prior to visit.    Allergies  Allergen Reactions   Cefprozil    Ketorolac Tromethamine    Morphine    Sulfa Antibiotics     Patient reports severe reactions depending on the strength.    Patient Care Team: Jerrol Banana., MD as PCP - General (Family Medicine) Oneta Rack, MD (Dermatology) Vladimir Crofts, MD as Consulting Physician (Neurology)  Review of Systems  All other systems reviewed and are negative.   Last hemoglobin A1c Lab Results  Component Value Date   HGBA1C 6.1 (H) 07/27/2022        Objective    Vitals: BP 133/86 (BP Location: Right Arm, Patient Position: Sitting, Cuff Size: Large)   Pulse 60   Resp 16   Ht '6\' 2"'$  (1.88 m)   Wt 220 lb (99.8 kg)   SpO2 96%   BMI 28.25 kg/m  BP Readings from Last 3 Encounters:  07/27/22 133/86  07/21/22 (!) 132/94   02/05/22 124/76   Wt Readings from Last 3 Encounters:  07/27/22 220 lb (99.8 kg)  07/21/22 225 lb (102.1 kg)  02/05/22 226 lb (102.5 kg)       Physical Exam Vitals reviewed.  Constitutional:      Appearance: Normal appearance.  HENT:     Head: Normocephalic and atraumatic.     Right Ear: External ear normal.     Left Ear: External ear normal.  Eyes:     General: No scleral icterus.    Conjunctiva/sclera: Conjunctivae normal.  Neck:     Vascular: No carotid bruit.  Cardiovascular:     Rate and Rhythm: Normal rate and regular rhythm.     Heart sounds: Normal heart sounds.  Pulmonary:     Breath sounds: Normal breath sounds.  Abdominal:     Palpations: Abdomen is soft.  Musculoskeletal:     Right lower leg: No edema.     Left lower leg: No edema.  Lymphadenopathy:     Cervical: No cervical adenopathy.  Skin:    General: Skin is warm and dry.  Neurological:     General: No focal deficit present.     Mental Status: He is alert and oriented to person, place, and time.  Psychiatric:        Mood and Affect: Mood normal.  Behavior: Behavior normal.        Thought Content: Thought content normal.        Judgment: Judgment normal.      Most recent functional status assessment:    07/27/2022    9:22 AM  In your present state of health, do you have any difficulty performing the following activities:  Hearing? 1  Vision? 0  Difficulty concentrating or making decisions? 0  Walking or climbing stairs? 0  Dressing or bathing? 0  Doing errands, shopping? 0   Most recent fall risk assessment:    07/27/2022    9:22 AM  Fall Risk   Falls in the past year? 0  Number falls in past yr: 0  Injury with Fall? 0  Risk for fall due to : No Fall Risks  Follow up Falls evaluation completed    Most recent depression screenings:    07/27/2022    9:22 AM 02/05/2022    3:55 PM  PHQ 2/9 Scores  PHQ - 2 Score 0 1  PHQ- 9 Score 0 2   Most recent cognitive screening:     07/10/2020   10:12 AM  6CIT Screen  What Year? 0 points  What month? 0 points  What time? 0 points  Count back from 20 0 points  Months in reverse 0 points  Repeat phrase 0 points  Total Score 0 points   Most recent Audit-C alcohol use screening    07/27/2022    9:21 AM  Alcohol Use Disorder Test (AUDIT)  1. How often do you have a drink containing alcohol? 2  2. How many drinks containing alcohol do you have on a typical day when you are drinking? 0  3. How often do you have six or more drinks on one occasion? 0  AUDIT-C Score 2   A score of 3 or more in women, and 4 or more in men indicates increased risk for alcohol abuse, EXCEPT if all of the points are from question 1   No results found for any visits on 07/27/22.  Assessment & Plan     Annual wellness visit done today including the all of the following: Reviewed patient's Family Medical History Reviewed and updated list of patient's medical providers Assessment of cognitive impairment was done Assessed patient's functional ability Established a written schedule for health screening Robert Fuller Completed and Reviewed  Exercise Activities and Dietary recommendations  Goals      Care Management     Current Barriers:  Care Coordination needs related to prediabetes.  (Hx of GERD)  Case Manager Clinical Goal(s):  Over the next 90 days, patient will continue to adhere to providers diet and treatment recommendations r/t prediabetes. Over the next 90 days, patient will take all medications as prescribed. Over the next 90 days, patient will attend all scheduled medical appointments.  Interventions:  Reviewed medications. Encouraged to take medications as prescribed. Encouraged to notify care management team with concerns regarding prescription costs. Discussed compliance with provider's recommendations and nutritional intake.  Reviewed medical appointments. Encouraged to attend appointments as scheduled  to prevent delays in care. Denies concerns regarding transportation. Reports pending appointment with Dr. Alice Reichert (Gastroenterology) on 02/08/20.   Patient Self Care Activities:  Self administers medications  Attends scheduled provider appointments Calls pharmacy for medication refills Attends church or other social activities Performs ADL's independently Performs IADL's independently Calls provider office for new concerns or questions  Please see past updates related to this goal by clicking  on the "Past Updates" button in the selected goal       DIET - REDUCE CALORIE INTAKE     Continue current diet plan of cutting out all white foods in diet to aid with weight loss.         Immunization History  Administered Date(s) Administered   Moderna Sars-Covid-2 Vaccination 01/04/2020, 01/31/2020   Pneumococcal Conjugate-13 12/29/2017   Pneumococcal Polysaccharide-23 07/18/2019   Rabies, IM 05/05/2016, 05/08/2016, 05/12/2016   Tdap 07/06/2012   Zoster, Live 07/06/2012    Health Maintenance  Topic Date Due   Zoster Vaccines- Shingrix (1 of 2) Never done   COVID-19 Vaccine (3 - Moderna risk series) 02/28/2020   TETANUS/TDAP  07/06/2022   INFLUENZA VACCINE  06/30/2022   COLONOSCOPY (Pts 45-56yr Insurance coverage will need to be confirmed)  07/21/2025   Pneumonia Vaccine 71 Years old  Completed   Hepatitis C Screening  Completed   HPV VACCINES  Aged Out     Discussed health benefits of physical activity, and encouraged him to engage in regular exercise appropriate for his age and condition.    1. Encounter for annual wellness visit (AWV) in Medicare patient   2. Annual physical exam   3. Pre-diabetes  - Lipid panel - Hemoglobin A1c  4. Iron deficiency anemia due to chronic blood loss  - Lipid panel - Hemoglobin A1c  5. Screening for metabolic disorder  - Lipid panel - Hemoglobin A1c  6. Neuropathy  - Lipid panel - Hemoglobin A1c  7. Barrett's esophagus  without dysplasia -lifelong PPI  8. Parkinson's disease ([place] Mild at this point.  No need for medication.  Followed by neurology.    No follow-ups on file.     I, RWilhemena Durie MD, have reviewed all documentation for this visit. The documentation on 07/28/22 for the exam, diagnosis, procedures, and orders are all accurate and complete.    Sayla Golonka GCranford Mon MD  [phone](phone) [phone](fax)  CPrentiss

## 2022-07-27 ENCOUNTER — Ambulatory Visit (INDEPENDENT_AMBULATORY_CARE_PROVIDER_SITE_OTHER): Payer: Medicare HMO | Admitting: Family Medicine

## 2022-07-27 ENCOUNTER — Encounter: Payer: Self-pay | Admitting: Family Medicine

## 2022-07-27 VITALS — BP 133/86 | HR 60 | Resp 16 | Ht 74.0 in | Wt 220.0 lb

## 2022-07-27 DIAGNOSIS — G20 Parkinson's disease: Secondary | ICD-10-CM | POA: Diagnosis not present

## 2022-07-27 DIAGNOSIS — D500 Iron deficiency anemia secondary to blood loss (chronic): Secondary | ICD-10-CM

## 2022-07-27 DIAGNOSIS — R7303 Prediabetes: Secondary | ICD-10-CM | POA: Diagnosis not present

## 2022-07-27 DIAGNOSIS — Z13228 Encounter for screening for other metabolic disorders: Secondary | ICD-10-CM

## 2022-07-27 DIAGNOSIS — Z0000 Encounter for general adult medical examination without abnormal findings: Secondary | ICD-10-CM | POA: Diagnosis not present

## 2022-07-27 DIAGNOSIS — G629 Polyneuropathy, unspecified: Secondary | ICD-10-CM | POA: Diagnosis not present

## 2022-07-27 DIAGNOSIS — G20A1 Parkinson's disease without dyskinesia, without mention of fluctuations: Secondary | ICD-10-CM

## 2022-07-27 DIAGNOSIS — Z136 Encounter for screening for cardiovascular disorders: Secondary | ICD-10-CM | POA: Diagnosis not present

## 2022-07-27 DIAGNOSIS — K2270 Barrett's esophagus without dysplasia: Secondary | ICD-10-CM

## 2022-07-28 LAB — HEMOGLOBIN A1C
Est. average glucose Bld gHb Est-mCnc: 128 mg/dL
Hgb A1c MFr Bld: 6.1 % — ABNORMAL HIGH (ref 4.8–5.6)

## 2022-07-28 LAB — LIPID PANEL
Chol/HDL Ratio: 3 ratio (ref 0.0–5.0)
Cholesterol, Total: 152 mg/dL (ref 100–199)
HDL: 51 mg/dL (ref >39)
LDL Chol Calc (NIH): 85 mg/dL (ref 0–99)
Triglycerides: 85 mg/dL (ref 0–149)
VLDL Cholesterol Cal: 16 mg/dL (ref 5–40)

## 2022-08-04 ENCOUNTER — Telehealth: Payer: Self-pay

## 2022-08-04 NOTE — Telephone Encounter (Signed)
Pt call to discuss lab results.  PT wanted to know what his A1C was. Gave pt information and shared provider's note.   Labs stable. Please advise patient.  Written by Jerrol Banana., MD on 07/31/2022 11:31 AM EDT

## 2022-09-14 DIAGNOSIS — G20A1 Parkinson's disease without dyskinesia, without mention of fluctuations: Secondary | ICD-10-CM | POA: Diagnosis not present

## 2022-09-14 DIAGNOSIS — R0683 Snoring: Secondary | ICD-10-CM | POA: Diagnosis not present

## 2022-09-14 DIAGNOSIS — G5603 Carpal tunnel syndrome, bilateral upper limbs: Secondary | ICD-10-CM | POA: Diagnosis not present

## 2022-09-16 ENCOUNTER — Encounter: Payer: Medicare HMO | Admitting: Occupational Therapy

## 2022-09-18 DIAGNOSIS — D1801 Hemangioma of skin and subcutaneous tissue: Secondary | ICD-10-CM | POA: Diagnosis not present

## 2022-09-18 DIAGNOSIS — L02821 Furuncle of head [any part, except face]: Secondary | ICD-10-CM | POA: Diagnosis not present

## 2022-09-23 ENCOUNTER — Encounter: Payer: Medicare HMO | Admitting: Occupational Therapy

## 2022-09-24 ENCOUNTER — Encounter: Payer: Medicare HMO | Admitting: Occupational Therapy

## 2022-09-28 ENCOUNTER — Encounter: Payer: Medicare HMO | Admitting: Occupational Therapy

## 2022-09-28 ENCOUNTER — Ambulatory Visit: Payer: Medicare HMO | Attending: Neurology | Admitting: Occupational Therapy

## 2022-09-28 DIAGNOSIS — M6281 Muscle weakness (generalized): Secondary | ICD-10-CM | POA: Insufficient documentation

## 2022-09-28 DIAGNOSIS — R278 Other lack of coordination: Secondary | ICD-10-CM | POA: Insufficient documentation

## 2022-09-28 NOTE — Therapy (Signed)
OUTPATIENT OCCUPATIONAL THERAPY NEURO EVALUATION  Patient Name: Robert Fuller MRN: [id number] DOB:1951-10-12, 71 y.o., male Today's Date: 09/28/2022  PCP: Dr. Miguel Aschoff, MD REFERRING PROVIDER: Dr. Jennings Books, MD   OT End of Session - 09/28/22 1745     Visit Number 1    Number of Visits 17    Date for OT Re-Evaluation 11/09/22    OT Start Time 1600    OT Stop Time 1710    OT Time Calculation (min) 70 min    Activity Tolerance Patient tolerated treatment well                Past Medical History:  Diagnosis Date   Barrett's esophagus    Cough    Esophageal reflux    Kidney stone    Past Surgical History:  Procedure Laterality Date   COLONOSCOPY WITH PROPOFOL N/A 07/21/2022   Procedure: COLONOSCOPY WITH PROPOFOL;  Surgeon: [name], MD;  Location: [facility] ENDOSCOPY;  Service: Endoscopy;  Laterality: N/A;   ESOPHAGOGASTRODUODENOSCOPY (EGD) WITH PROPOFOL N/A 04/06/2016   Procedure: ESOPHAGOGASTRODUODENOSCOPY (EGD) WITH PROPOFOL;  Surgeon: Hulen Luster, MD;  Location: [facility] ENDOSCOPY;  Service: Gastroenterology;  Laterality: N/A;   ESOPHAGOGASTRODUODENOSCOPY (EGD) WITH PROPOFOL N/A 07/21/2022   Procedure: ESOPHAGOGASTRODUODENOSCOPY (EGD) WITH PROPOFOL;  Surgeon: [name], MD;  Location: [facility] ENDOSCOPY;  Service: Endoscopy;  Laterality: N/A;   NO PAST SURGERIES     UPPER GI ENDOSCOPY  04/06/16   normal other than esophageal changes present consistent with barretts esophagus, repeat 2019   Patient Active Problem List   Diagnosis Date Noted   ED (erectile dysfunction) of organic origin 08/04/2016   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 reports that he just found out he has Parkinson's disease at his last MD visit a couple of weeks ago. Pt accompanied by: self  PERTINENT HISTORY:   Pt. is a 71 y.o. male who was recently diagnosed with Parkinson's Disease with bilateral hand tremors on 09/14/2022. Pt. has bilateral Carpal Tunnel Syndrome with numbness in digits of his bilateral hands.   PRECAUTIONS: None  WEIGHT BEARING RESTRICTIONS: No  PAIN:  Are you having pain? No  FALLS: Has patient fallen in last 6 months? No  LIVING ENVIRONMENT: Lives with: lives with their spouse Lives in: House/apartment Stairs: Yes: Internal: 3 floors steps; on right going up and on left going up 3 stories Has following equipment at home: Grab bars  PLOF: Independent  PATIENT GOALS: To improve amplitude of movement  OBJECTIVE:   HAND DOMINANCE: Right  ADLs: Overall ADLs:  Independent Transfers/ambulation related to ADLs: Eating: Independent Grooming:  Independent UB Dressing: Independent LB Dressing: Independent Toileting: Independent Bathing: Independent Tub Shower transfers:  Independent no grab bars Equipment: Grab bars  IADLs: Shopping: Independent Light housekeeping:  Independent Meal Prep:  Independent Community mobility: driving Medication management:  Independent Financial management: No change Handwriting: 75% legible: Pt. reports decreased legibility when multitasking   MOBILITY STATUS: Independent  POSTURE COMMENTS:  Pectoral tightness with rounded shoulders Sitting balance:  I Unsupported static, and dynamic: good  ACTIVITY TOLERANCE: Activity tolerance: WFL  FUNCTIONAL OUTCOME MEASURES:  FOTO: 92 Timed Up,  and Go  (TUG) Score: 8 sec. 5 Times sit to stand Score: 12 sec. (Pt. did not come into a full stand for the last rep. 2/2 foot placement) 6-Minute Walk Test Score: 1875 ft. Berg Balance Scale Score: 56  Freezing of Gait  Questionnaire Score: 0  Bilateral  Hand Tremors with the left greater than right. Pt. Reports tremors are worse with dual or multitasking, also when under pressure.   UPPER EXTREMITY ROM:    Active ROM Right Eval WFL Left Eval [facility]  Shoulder flexion    Shoulder abduction    Shoulder adduction    Shoulder extension    Shoulder internal rotation    Shoulder external rotation    Elbow flexion    Elbow extension    Wrist flexion    Wrist extension    Wrist ulnar deviation    Wrist radial deviation    Wrist pronation    Wrist supination    (Blank rows = not tested)  UPPER EXTREMITY MMT:     MMT Right eval Left eval  Shoulder flexion 5/5 5/5  Shoulder abduction 5/5 5/5  Shoulder adduction    Shoulder extension    Shoulder internal rotation    Shoulder external rotation    Middle trapezius    Lower trapezius    Elbow flexion 5/5 5/5  Elbow extension 5/5 5/5  Wrist flexion    Wrist extension 5/5 5/5  Wrist ulnar deviation    Wrist radial deviation    Wrist pronation    Wrist supination    (Blank rows = not tested)  HAND FUNCTION: Grip strength: Right: 112 lbs; Left: 110 lbs, Lateral pinch: Right: 30 lbs, Left: 26 lbs, and 3 point pinch: Right: 23 lbs, Left: 23 lbs  COORDINATION: 9 Hole Peg test: Right: 26 sec; Left: 24 sec  with Left hand tremors noted   SENSATION: Numbness in the bilateral 2nd, 3rd, and 4th digits  EDEMA: None  COGNITION: Overall cognitive status: Within functional limits for tasks assessed  VISION:  [facility] for tasks assessed  PERCEPTION: WFL  PRAXIS: WFL   TODAY'S TREATMENT:                                                                                                                              DATE:  09/28/2022  Pt. Participated in the initial evaluation   PATIENT EDUCATION: Education details: OT services, LSVT program Person educated: Patient Education method: Explanation and Demonstration Education comprehension:  verbalized understanding, returned demonstration, and needs further education  HOME EXERCISE PROGRAM:     GOALS: Goals reviewed with patient? Yes  SHORT TERM GOALS: Target date: 10/19/2022    Pt. Will independently demonstrate the proper techniques for the Maximal Daily Exercise HEP. Baseline: Pt. Currently does not have Goal status: INITIAL  LONG TERM GOALS: Target date: 11/09/2022    Pt. Will increase the FOTO score by 2 points to reflect Pt. perceived improvement with assessment specific ADL,  and IADLs. Baseline: FOTO: 92 Goal status: INITIAL  2.  Pt. Will initiate, and sustain bilateral reciprocal arm swing 100% of the time during the 6 min. Walk test to improve posture, and gait performance. Baseline: Eval: Pt. Presents with limited arm swing  during gait Goal status: INITIAL  3.  Pt. Will assume fully upright midline standing posture 100% of the time for all reps performed, and timed on the 5 times sit to stand test. Baseline: Eval: Forward trunk, head and neck posture, the 5th rep Pt. Did not assume full upright midline posture (half stand) Goal status: INITIAL  4.  Pt. Will improve writing skills in order to be able to independently, legibly, and efficiently fill out checks while under pressure, and multitasking.  Baseline: Eval: Pt. Is able to write his name with 75% legibility. Pt. Reports legibility changes, and tremors when writing out checks while under pressure.  Goal status: INITIAL   ASSESSMENT:  CLINICAL IMPRESSION:  Patient is a 71 y.o. male who was seen today for occupational therapy evaluation for the  LSVT BIG program. Pt. Was referred to this program by his Neurologist secondary to a recent diagnosis of Parkinson's Disease. Pt. Presents with bilateral hand tremors with the left being greater than the right. Pt. Presents with increased tremors when upset, under pressure, timed, or when dual and multitasking.  Pt. Presents with limited bilateral reciprocal  arm swing during gait, as well as tight pectoral musculature leading to rounded shoulders, and forward head, and neck posturing. Pt. is at risk for increasing gait shuffling which increases his risk for falls. Pt. Will benefit from OT services for LSVT to improve the amplitude of movements, improve posture, and promote safe gait, and overall function during ADLs, and IADL tasks.   PERFORMANCE DEFICITS: in functional skills including ADLs, IADLs, coordination, dexterity, ROM, strength, pain, Fine motor control, Gross motor control, body mechanics, endurance, decreased knowledge of use of DME, and UE functional use, cognitive skills, and psychosocial skills including coping strategies, environmental adaptation, habits, and routines and behaviors.   IMPAIRMENTS: are limiting patient from ADLs, IADLs, and leisure.   CO-MORBIDITIES; may have co-morbidities  that affects occupational performance. Patient will benefit from skilled OT to address above impairments and improve overall function.  MODIFICATION OR ASSISTANCE TO COMPLETE EVALUATION: Min-Moderate modification of tasks or assist with assess necessary to complete an evaluation.  OT OCCUPATIONAL PROFILE AND HISTORY: Detailed assessment: Review of records and additional review of physical, cognitive, psychosocial history related to current functional performance.  CLINICAL DECISION MAKING: Moderate - several treatment options, min-mod task modification necessary  REHAB POTENTIAL: Good  EVALUATION COMPLEXITY: Moderate    PLAN:  OT FREQUENCY: 4x/week  OT DURATION: 6 weeks  PLANNED INTERVENTIONS: self care/ADL training, therapeutic exercise, therapeutic activity, neuromuscular re-education, manual therapy, passive range of motion, and paraffin, balance training  RECOMMENDED OTHER SERVICES: N/A  CONSULTED AND AGREED WITH PLAN OF CARE: Patient  PLAN FOR NEXT SESSION:  Initiate LSVT Treatment,  Introduce Maximal Daily Exercises.  Harrel Carina, MS, OTR/L   Harrel Carina, OT 09/28/2022, 4:11 PM

## 2022-10-05 ENCOUNTER — Ambulatory Visit: Payer: Medicare HMO | Attending: Neurology | Admitting: Occupational Therapy

## 2022-10-05 DIAGNOSIS — R278 Other lack of coordination: Secondary | ICD-10-CM | POA: Diagnosis not present

## 2022-10-05 DIAGNOSIS — M6281 Muscle weakness (generalized): Secondary | ICD-10-CM

## 2022-10-05 DIAGNOSIS — G20B2 Parkinson's disease with dyskinesia, with fluctuations: Secondary | ICD-10-CM | POA: Insufficient documentation

## 2022-10-06 ENCOUNTER — Ambulatory Visit: Payer: Medicare HMO

## 2022-10-06 DIAGNOSIS — R278 Other lack of coordination: Secondary | ICD-10-CM | POA: Diagnosis not present

## 2022-10-06 DIAGNOSIS — G20B2 Parkinson's disease with dyskinesia, with fluctuations: Secondary | ICD-10-CM

## 2022-10-06 DIAGNOSIS — M6281 Muscle weakness (generalized): Secondary | ICD-10-CM | POA: Diagnosis not present

## 2022-10-06 NOTE — Therapy (Addendum)
OUTPATIENT OCCUPATIONAL THERAPY NEURO EVALUATION  Patient Name: Robert Fuller MRN: [id number] DOB:[birth year], 71 y.o., male Today's Date: 09/28/2022  PCP: Dr. Miguel Aschoff, MD REFERRING PROVIDER: Dr. Jennings Books, MD   OT End of Session - 10/06/22 0839     Visit Number 2    Number of Visits 17    Date for OT Re-Evaluation 11/09/22    OT Start Time 1600    OT Stop Time 1700    OT Time Calculation (min) 60 min    Activity Tolerance Patient tolerated treatment well    Behavior During Therapy [facility] for tasks assessed/performed                  Past Medical History:  Diagnosis Date   Barrett's esophagus    Cough    Esophageal reflux    Kidney stone    Past Surgical History:  Procedure Laterality Date   COLONOSCOPY WITH PROPOFOL N/A 07/21/2022   Procedure: COLONOSCOPY WITH PROPOFOL;  Surgeon: [name], MD;  Location: [facility] ENDOSCOPY;  Service: Endoscopy;  Laterality: N/A;   ESOPHAGOGASTRODUODENOSCOPY (EGD) WITH PROPOFOL N/A 04/06/2016   Procedure: ESOPHAGOGASTRODUODENOSCOPY (EGD) WITH PROPOFOL;  Surgeon: Hulen Luster, MD;  Location: [facility] ENDOSCOPY;  Service: Gastroenterology;  Laterality: N/A;   ESOPHAGOGASTRODUODENOSCOPY (EGD) WITH PROPOFOL N/A 07/21/2022   Procedure: ESOPHAGOGASTRODUODENOSCOPY (EGD) WITH PROPOFOL;  Surgeon: [name], MD;  Location: [facility] ENDOSCOPY;  Service: Endoscopy;  Laterality: N/A;   NO PAST SURGERIES     UPPER GI ENDOSCOPY  04/06/16   normal other than esophageal changes present consistent with barretts esophagus, repeat 2019   Patient Active Problem List   Diagnosis Date Noted   ED (erectile dysfunction) of organic origin 08/04/2016   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 Pt. Was provided with a new schedule.   SUBJECTIVE STATEMENT:  Pt. reports that he just found out he has Parkinson's disease at his last MD visit a couple of weeks ago. Pt accompanied by: self  PERTINENT HISTORY:   Pt. is a 71 y.o. male who was recently diagnosed with Parkinson's Disease with bilateral hand tremors on 09/14/2022. Pt. has bilateral Carpal Tunnel Syndrome with numbness in digits of his bilateral hands.   PRECAUTIONS: None  WEIGHT BEARING RESTRICTIONS: No  PAIN:  Are you having pain? No  FALLS: Has patient fallen in last 6 months? No  LIVING ENVIRONMENT: Lives with: lives with their spouse Lives in: House/apartment Stairs: Yes: Internal: 3 floors steps; on right going up and on left going up 3 stories Has following equipment at home: Grab bars  PLOF: Independent  PATIENT GOALS: To improve amplitude of movement  OBJECTIVE:   HAND DOMINANCE: Right  ADLs: Overall ADLs:  Independent Transfers/ambulation related to ADLs: Eating: Independent Grooming:  Independent UB Dressing: Independent LB Dressing: Independent Toileting: Independent Bathing: Independent Tub Shower transfers:  Independent no grab bars Equipment: Grab bars  IADLs: Shopping: Independent Light housekeeping:  Independent Meal Prep:  Independent Community mobility: driving Medication management:  Independent Financial management: No change Handwriting: 75% legible: Pt. reports decreased legibility when multitasking   MOBILITY STATUS: Independent  POSTURE COMMENTS:  Pectoral tightness with rounded shoulders Sitting balance:  I Unsupported static, and dynamic: good  ACTIVITY TOLERANCE: Activity tolerance: WFL  FUNCTIONAL OUTCOME MEASURES:  FOTO: 92 Timed Up, and Go  (TUG) Score: 8 sec. 5 Times sit to stand Score: 12 sec. (Pt.  did not come into a full stand for the last rep. 2/2 foot placement) 6-Minute Walk Test Score: 1875 ft. Berg Balance Scale Score: 56  Freezing of Gait Questionnaire Score: 0  Bilateral  Hand Tremors with the left greater than right. Pt. Reports tremors are worse with dual or multitasking, also when under pressure.   UPPER EXTREMITY ROM:    Active ROM Right Eval WFL Left Eval [facility]  Shoulder flexion    Shoulder abduction    Shoulder adduction    Shoulder extension    Shoulder internal rotation    Shoulder external rotation    Elbow flexion    Elbow extension    Wrist flexion    Wrist extension    Wrist ulnar deviation    Wrist radial deviation    Wrist pronation    Wrist supination    (Blank rows = not tested)  UPPER EXTREMITY MMT:     MMT Right eval Left eval  Shoulder flexion 5/5 5/5  Shoulder abduction 5/5 5/5  Shoulder adduction    Shoulder extension    Shoulder internal rotation    Shoulder external rotation    Middle trapezius    Lower trapezius    Elbow flexion 5/5 5/5  Elbow extension 5/5 5/5  Wrist flexion    Wrist extension 5/5 5/5  Wrist ulnar deviation    Wrist radial deviation    Wrist pronation    Wrist supination    (Blank rows = not tested)  HAND FUNCTION: Grip strength: Right: 112 lbs; Left: 110 lbs, Lateral pinch: Right: 30 lbs, Left: 26 lbs, and 3 point pinch: Right: 23 lbs, Left: 23 lbs  COORDINATION: 9 Hole Peg test: Right: 26 sec; Left: 24 sec  with Left hand tremors noted   SENSATION: Numbness in the bilateral 2nd, 3rd, and 4th digits  EDEMA: None  COGNITION: Overall cognitive status: Within functional limits for tasks assessed  VISION:  WFL for tasks assessed  PERCEPTION: WFL  PRAXIS: WFL   TODAY'S TREATMENT:                                                                                                                              DATE:  10/06/2022  Pt. tolerated pectoral stretches in sitting, followed by standing at  the wall for feedback. Pectoral stretches were performed to promote posture, and alignment.    LSVT:  Patient was seen for LSVT Daily Session Maximal Daily Exercises for facilitation, and coordination of movement. Maximum Sustained Movements are designed to rescale the amplitude of movement output for generalization to daily functional activities. Pt. Performed as follows for 1 set of 10 repetitions each multi-directional sustained movements: 1) Floor to  ceiling  2) Side to side multidirectional   Multi-directional repetitive movements performed in standing and are designed to provide retraining effort needed for sustained muscle activation in tasks. Performed as follows for 1 set of 10 repetitions each of multi-directional repetitive movements: 3) Step and reach forward step 4) Step and reach sideways step   5) Step and reach backwards step 6) Rock and reach forward/backward  7) Rock and reach sideways   Identified Functional Component Tasks include: Sit to stand BIG functional component task with supervision 10 reps  2. BIG reciprocal arm swing 3. Extending right shoulder to reach for items behind him. 4. Stepping over an obstacle 5. Backing up a step  Patient was provided with a HEP with a visual handout to work on the Maximal Daily Exercises at home. Patient to practice doing  BIG ambulation:  1155 ft. With increased cues being required for right arm swing, and to increase amplitude of movements.   PATIENT EDUCATION: Education details:  Sustained, and multi-directional  Maximal Daily exercises.  Person educated: Patient Education method: Customer service manager Education comprehension: verbalized understanding, returned demonstration, and needs further education  HOME EXERCISE PROGRAM:     GOALS: Goals reviewed with patient? Yes  SHORT TERM GOALS: Target date: 10/19/2022    Pt. Will independently demonstrate the proper techniques for the Maximal Daily Exercise  HEP. Baseline: Pt. Currently does not have Goal status: INITIAL  LONG TERM GOALS: Target date: 11/09/2022    Pt. Will increase the FOTO score by 2 points to reflect Pt. perceived improvement with assessment specific ADL, and IADLs. Baseline: FOTO: 92 Goal status: INITIAL  2.  Pt. Will initiate, and sustain bilateral reciprocal arm swing 100% of the time during the 6 min. Walk test to improve posture, and gait performance. Baseline: Eval: Pt. Presents with limited arm swing  during gait Goal status: INITIAL  3.  Pt. Will assume fully upright midline standing posture 100% of the time for all reps performed, and timed on the 5 times sit to stand test. Baseline: Eval: Forward trunk, head and neck posture, the 5th rep Pt. Did not assume full upright midline posture (half stand) Goal status: INITIAL  4.  Pt. Will improve writing skills in order to be able to independently, legibly, and efficiently fill out checks while under pressure, and multitasking.  Baseline: Eval: Pt. Is able to write his name with 75% legibility. Pt. Reports legibility changes, and tremors when writing out checks while under pressure.  Goal status: INITIAL   ASSESSMENT:  CLINICAL IMPRESSION:   Pt. required consistent and frequent verbal cues, tactile cues, and cues for visual demonstration for form, technique, and amplitude of movements during each of the Sustained, and Multidirectional Maximal Daily Exercises. Pt. Presented with LOB during the Multi-directional Forward Step and Reach when stepping forward with the right LE. Pt. Presented with limited arm swing during BIG walking, with significantly less arm swing on the right. Pt. Continues to benefit from OT services for LSVT to improve the amplitude of movements, improve posture, and promote safe gait, and overall function during ADLs, and IADL tasks.   PERFORMANCE DEFICITS: in functional skills including ADLs, IADLs, coordination, dexterity, ROM, strength, pain,  Fine motor control, Gross motor control, body mechanics, endurance, decreased knowledge of use of DME, and UE functional use, cognitive skills, and psychosocial skills including coping strategies, environmental adaptation, habits, and routines and behaviors.   IMPAIRMENTS: are limiting patient from ADLs, IADLs, and leisure.   CO-MORBIDITIES; may have co-morbidities  that affects occupational performance. Patient will benefit from skilled OT to address above impairments and improve overall function.  MODIFICATION OR ASSISTANCE TO COMPLETE EVALUATION: Min-Moderate modification of tasks or assist with assess necessary to complete an evaluation.  OT OCCUPATIONAL PROFILE AND HISTORY: Detailed assessment: Review of records and additional review of physical, cognitive, psychosocial history related to current functional performance.  CLINICAL DECISION MAKING: Moderate - several treatment options, min-mod task modification necessary  REHAB POTENTIAL: Good  EVALUATION COMPLEXITY: Moderate    PLAN:  OT FREQUENCY: 4x/week  OT DURATION: 6 weeks  PLANNED INTERVENTIONS: self care/ADL training, therapeutic exercise, therapeutic activity, neuromuscular re-education, manual therapy, passive range of motion, and paraffin, balance training  RECOMMENDED OTHER SERVICES: N/A  CONSULTED AND AGREED WITH PLAN OF CARE: Patient  PLAN FOR NEXT SESSION:  Continues to work on form and technique with the Maximal Daily exercises.   Harrel Carina, MS, OTR/L   Harrel Carina, OT 09/28/2022, 4:11 PM

## 2022-10-07 ENCOUNTER — Ambulatory Visit: Payer: Medicare HMO | Admitting: Occupational Therapy

## 2022-10-07 DIAGNOSIS — R278 Other lack of coordination: Secondary | ICD-10-CM | POA: Diagnosis not present

## 2022-10-07 DIAGNOSIS — G20B2 Parkinson's disease with dyskinesia, with fluctuations: Secondary | ICD-10-CM | POA: Diagnosis not present

## 2022-10-07 DIAGNOSIS — M6281 Muscle weakness (generalized): Secondary | ICD-10-CM

## 2022-10-07 NOTE — Therapy (Signed)
OUTPATIENT OCCUPATIONAL THERAPY NEURO EVALUATION  Patient Name: Robert Fuller MRN: [id number] DOB:[birth year], 71 y.o., male Today's Date: 09/28/2022  PCP: Dr. Miguel Aschoff, MD REFERRING PROVIDER: Dr. Jennings Books, MD   OT End of Session - 10/07/22 2301     Visit Number 4    Number of Visits 17    Date for OT Re-Evaluation 11/09/22    OT Start Time 1600    OT Stop Time 1700    OT Time Calculation (min) 60 min    Activity Tolerance Patient tolerated treatment well    Behavior During Therapy [facility] for tasks assessed/performed                    Past Medical History:  Diagnosis Date   Barrett's esophagus    Cough    Esophageal reflux    Kidney stone    Past Surgical History:  Procedure Laterality Date   COLONOSCOPY WITH PROPOFOL N/A 07/21/2022   Procedure: COLONOSCOPY WITH PROPOFOL;  Surgeon: [name], MD;  Location: [facility] ENDOSCOPY;  Service: Endoscopy;  Laterality: N/A;   ESOPHAGOGASTRODUODENOSCOPY (EGD) WITH PROPOFOL N/A 04/06/2016   Procedure: ESOPHAGOGASTRODUODENOSCOPY (EGD) WITH PROPOFOL;  Surgeon: Hulen Luster, MD;  Location: [facility] ENDOSCOPY;  Service: Gastroenterology;  Laterality: N/A;   ESOPHAGOGASTRODUODENOSCOPY (EGD) WITH PROPOFOL N/A 07/21/2022   Procedure: ESOPHAGOGASTRODUODENOSCOPY (EGD) WITH PROPOFOL;  Surgeon: [name], MD;  Location: [facility] ENDOSCOPY;  Service: Endoscopy;  Laterality: N/A;   NO PAST SURGERIES     UPPER GI ENDOSCOPY  04/06/16   normal other than esophageal changes present consistent with barretts esophagus, repeat 2019   Patient Active Problem List   Diagnosis Date Noted   ED (erectile dysfunction) of organic origin 08/04/2016   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 That he went home, and went to bed from fatigue following the first LSVT treatment day  SUBJECTIVE STATEMENT:  Pt. reports that he just found out he has Parkinson's disease at his last MD visit a couple of weeks ago. Pt accompanied by: self  PERTINENT HISTORY:   Pt. is a 71 y.o. male who was recently diagnosed with Parkinson's Disease with bilateral hand tremors on 09/14/2022. Pt. has bilateral Carpal Tunnel Syndrome with numbness in digits of his bilateral hands.   PRECAUTIONS: None  WEIGHT BEARING RESTRICTIONS: No  PAIN:  Are you having pain? No  FALLS: Has patient fallen in last 6 months? No  LIVING ENVIRONMENT: Lives with: lives with their spouse Lives in: House/apartment Stairs: Yes: Internal: 3 floors steps; on right going up and on left going up 3 stories Has following equipment at home: Grab bars  PLOF: Independent  PATIENT GOALS: To improve amplitude of movement  OBJECTIVE:   HAND DOMINANCE: Right  ADLs: Overall ADLs:  Independent Transfers/ambulation related to ADLs: Eating: Independent Grooming:  Independent UB Dressing: Independent LB Dressing: Independent Toileting: Independent Bathing: Independent Tub Shower transfers:  Independent no grab bars Equipment: Grab bars  IADLs: Shopping: Independent Light housekeeping:  Independent Meal Prep:  Independent Community mobility: driving Medication management:  Independent Financial management: No change Handwriting: 75% legible: Pt. reports decreased legibility when multitasking   MOBILITY STATUS: Independent  POSTURE COMMENTS:  Pectoral tightness with rounded shoulders Sitting balance:  I Unsupported static, and dynamic: good  ACTIVITY TOLERANCE: Activity tolerance: WFL  FUNCTIONAL OUTCOME MEASURES:  FOTO: 92 Timed Up, and Go  (TUG) Score: 8 sec. 5 Times sit to stand Score: 12 sec. (Pt. did not  come into a full stand for the last rep. 2/2 foot placement) 6-Minute Walk Test Score: 1875 ft. Berg Balance Scale Score: 56  Freezing of Gait Questionnaire Score: 0  Bilateral  Hand Tremors with the left greater than right. Pt. Reports tremors are worse with dual or multitasking, also when under pressure.   UPPER EXTREMITY ROM:    Active ROM Right Eval WFL Left Eval [facility]  Shoulder flexion    Shoulder abduction    Shoulder adduction    Shoulder extension    Shoulder internal rotation    Shoulder external rotation    Elbow flexion    Elbow extension    Wrist flexion    Wrist extension    Wrist ulnar deviation    Wrist radial deviation    Wrist pronation    Wrist supination    (Blank rows = not tested)  UPPER EXTREMITY MMT:     MMT Right eval Left eval  Shoulder flexion 5/5 5/5  Shoulder abduction 5/5 5/5  Shoulder adduction    Shoulder extension    Shoulder internal rotation    Shoulder external rotation    Middle trapezius    Lower trapezius    Elbow flexion 5/5 5/5  Elbow extension 5/5 5/5  Wrist flexion    Wrist extension 5/5 5/5  Wrist ulnar deviation    Wrist radial deviation    Wrist pronation    Wrist supination    (Blank rows = not tested)  HAND FUNCTION: Grip strength: Right: 112 lbs; Left: 110 lbs, Lateral pinch: Right: 30 lbs, Left: 26 lbs, and 3 point pinch: Right: 23 lbs, Left: 23 lbs  COORDINATION: 9 Hole Peg test: Right: 26 sec; Left: 24 sec  with Left hand tremors noted   SENSATION: Numbness in the bilateral 2nd, 3rd, and 4th digits  EDEMA: None  COGNITION: Overall cognitive status: Within functional limits for tasks assessed  VISION:  WFL for tasks assessed  PERCEPTION: WFL  PRAXIS: WFL   TODAY'S TREATMENT:                                                                                                                              DATE:  10/06/2022  Pt. tolerated pectoral stretches in standing at the wall for feedback.  Pectoral stretches were performed to promote posture, and alignment.    LSVT:  Patient was seen for LSVT Daily Session Maximal Daily Exercises for facilitation, and coordination of movement.   Maximum Sustained Movements are designed to rescale the amplitude of movement output for generalization to daily functional activities.Pt. Performed as follows for 1 set of 10 repetitions each multi-directional sustained movements:  1)  Floor to ceiling  2) Side to side multidirectional   Multi-directional repetitive movements performed in standing and are designed to provide retraining effort needed for sustained muscle activation in tasks. Performed as follows for 1 set of 10 repetitions each of multi-directional repetitive movements:  3) Step and reach forward step 4) Step and reach sideways step   5) Step and reach backwards step 6) Rock and reach forward/backward  7) Rock and reach sideways   Identified Functional Component Tasks include:  Sit to stand BIG functional component task with supervision 5 reps  2. BIG reciprocal arm swing 3. Extending right shoulder to reach for items behind him. 4. Stepping over an obstacle 5. Writing  Pt. Worked on balance with turning, and tapping cones placed in a circle with his foot. Pt. Worked in a clockwise direction, followed by counterclockwise for the right, and left LE. Pt. Then worked on tapping every other cone in each direction. Pt. Worked on stepping over a linear set of cones, followed by reps of stepping through the line of cones in an"S" pattern, and reps of side stepping through the cones in a ladder pattern. Pt. Required cues to increase the amplitude of movements, and upright posture during each on these exercises.   Pt. worked on Improving the amplitude of bilateral arm swing reaching to grasp pegs at a tabletop surface in front of him, and extending posteriorly to place the pegs in a container behind him.  Pt. Then worked on bilateral arm  swing while standing, and swinging his arms using a wall behind him for feedback, and to encourage increased amplitude of the arm swing especially posteriorly when touching the wall with alternating hands.  BIG ambulation:    1155 ft. With increased cues being required for right arm swing, and to increase amplitude of movements. Pt. Worked on BIG walking, and BIG marching.       PATIENT EDUCATION: Education details:  Sustained, and multi-directional  Maximal Daily exercises.  Person educated: Patient Education method: Customer service manager Education comprehension: verbalized understanding, returned demonstration, and needs further education  HOME EXERCISE PROGRAM:     GOALS: Goals reviewed with patient? Yes  SHORT TERM GOALS: Target date: 10/19/2022    Pt. Will independently demonstrate the proper techniques for the Maximal Daily Exercise HEP. Baseline: Pt. Currently does not have Goal status: INITIAL  LONG TERM GOALS: Target date: 11/09/2022    Pt. Will increase the FOTO score by 2 points to reflect Pt. perceived improvement with assessment specific ADL, and IADLs. Baseline: FOTO: 92 Goal status: INITIAL  2.  Pt. Will initiate, and sustain bilateral reciprocal arm swing 100% of the time during the 6 min. Walk test to improve posture, and gait performance. Baseline: Eval: Pt. Presents with limited arm swing  during gait Goal status: INITIAL  3.  Pt. Will assume fully upright midline standing posture 100% of the time for all reps performed, and timed on the 5 times sit to stand test. Baseline: Eval: Forward trunk, head and neck posture, the 5th rep Pt. Did not assume full upright midline posture (half stand) Goal status: INITIAL  4.  Pt. Will improve writing skills in order to be able to independently, legibly, and efficiently fill out checks while under pressure, and multitasking.  Baseline: Eval: Pt. Is able to write his name with 75% legibility. Pt. Reports  legibility changes, and tremors when writing out checks while under pressure.  Goal status: INITIAL   ASSESSMENT:  CLINICAL IMPRESSION:  Pt. Required verbal cues, tactile cues, and cues for visual demonstration for form, technique, and amplitude of movements during each of the Sustained, and Multidirectional Maximal Daily Exercises. Pt. Required less cuing overall today. Pt. Required cues when dual tasking with counting during the floor to ceiling MDE. Hand/finger flicks were added to the floor to ceiling MDE.  No LOB  noted during the Maximal daily exercises today.  Pt. Requires cues to increase the amplitude of bilateral arm swing during BIG walking, and upright posture.  Pt. Presented with LOB when turning and stepping onto cones placed in a circle, as well as when stepping over cones placed in a linear position. Pt. Was able to independently self correct each time. Pt. Continues to benefit from OT services for LSVT to improve the amplitude of movements, improve posture, and promote safe gait, and overall function during ADLs, and IADL tasks.   PERFORMANCE DEFICITS: in functional skills including ADLs, IADLs, coordination, dexterity, ROM, strength, pain, Fine motor control, Gross motor control, body mechanics, endurance, decreased knowledge of use of DME, and UE functional use, cognitive skills, and psychosocial skills including coping strategies, environmental adaptation, habits, and routines and behaviors.   IMPAIRMENTS: are limiting patient from ADLs, IADLs, and leisure.   CO-MORBIDITIES; may have co-morbidities  that affects occupational performance. Patient will benefit from skilled OT to address above impairments and improve overall function.  MODIFICATION OR ASSISTANCE TO COMPLETE EVALUATION: Min-Moderate modification of tasks or assist with assess necessary to complete an evaluation.  OT OCCUPATIONAL PROFILE AND HISTORY: Detailed assessment: Review of records and additional review of  physical, cognitive, psychosocial history related to current functional performance.  CLINICAL DECISION MAKING: Moderate - several treatment options, min-mod task modification necessary  REHAB POTENTIAL: Good  EVALUATION COMPLEXITY: Moderate    PLAN:  OT FREQUENCY: 4x/week  OT DURATION: 6 weeks  PLANNED INTERVENTIONS: self care/ADL training, therapeutic exercise, therapeutic activity, neuromuscular re-education, manual therapy, passive range of motion, and paraffin, balance training  RECOMMENDED OTHER SERVICES: N/A  CONSULTED AND AGREED WITH PLAN OF CARE: Patient  PLAN FOR NEXT SESSION:  Continues to work on form and technique with the Maximal Daily exercises.   Harrel Carina, MS, OTR/L   Harrel Carina, OT 09/28/2022, 4:11 PM

## 2022-10-07 NOTE — Therapy (Signed)
OUTPATIENT OCCUPATIONAL THERAPY NEURO LSVT BIG TREATMENT  Patient Name: Robert [facility] MRN: [id number] DOB:1951/10/02, 71 y.o., male Today's Date: 10/07/2022  PCP: Dr. Miguel Aschoff, MD REFERRING PROVIDER: Dr. Jennings Books, MD   OT End of Session - 10/07/22 0948     Visit Number 3    Number of Visits 17    Date for OT Re-Evaluation 11/09/22    OT Start Time 1615    OT Stop Time 1715    OT Time Calculation (min) 60 min    Activity Tolerance Patient tolerated treatment well    Behavior During Therapy [facility] for tasks assessed/performed              Past Medical History:  Diagnosis Date   Barrett's esophagus    Cough    Esophageal reflux    Kidney stone    Past Surgical History:  Procedure Laterality Date   COLONOSCOPY WITH PROPOFOL N/A 07/21/2022   Procedure: COLONOSCOPY WITH PROPOFOL;  Surgeon: Robert Rubenstein, MD;  Location: [facility] ENDOSCOPY;  Service: Endoscopy;  Laterality: N/A;   ESOPHAGOGASTRODUODENOSCOPY (EGD) WITH PROPOFOL N/A 04/06/2016   Procedure: ESOPHAGOGASTRODUODENOSCOPY (EGD) WITH PROPOFOL;  Surgeon: Robert Luster, MD;  Location: [facility];  Service: Gastroenterology;  Laterality: N/A;   ESOPHAGOGASTRODUODENOSCOPY (EGD) WITH PROPOFOL N/A 07/21/2022   Procedure: ESOPHAGOGASTRODUODENOSCOPY (EGD) WITH PROPOFOL;  Surgeon: Robert Rubenstein, MD;  Location: [facility] ENDOSCOPY;  Service: Endoscopy;  Laterality: N/A;   NO PAST SURGERIES     UPPER GI ENDOSCOPY  04/06/16   normal other than esophageal changes present consistent with barretts esophagus, repeat 2019   Patient Active Problem List   Diagnosis Date Noted   ED (erectile dysfunction) of organic origin 08/04/2016   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  SUBJECTIVE STATEMENT:  Pt. reports that he just found out he has Parkinson's disease at his last MD visit a couple of weeks ago. Pt accompanied by: self  PERTINENT HISTORY:   Pt. is a 71 y.o. male who was recently diagnosed with Parkinson's Disease with bilateral hand tremors on 09/14/2022. Pt. has bilateral Carpal Tunnel Syndrome with numbness in digits of his bilateral hands.   PRECAUTIONS: None  WEIGHT BEARING RESTRICTIONS: No  PAIN:  Are you having pain? No  FALLS: Has patient fallen in last 6 months? No  LIVING ENVIRONMENT: Lives with: lives with their spouse Lives in: House/apartment Stairs: Yes: Internal: 3 floors steps; on right going up and on left going up 3 stories Has following equipment at home: Grab bars  PLOF: Independent  PATIENT GOALS: To improve amplitude of movement  OBJECTIVE:   HAND DOMINANCE: Right  ADLs: Overall ADLs:  Independent Transfers/ambulation related to ADLs: Eating: Independent Grooming:  Independent UB Dressing: Independent LB Dressing: Independent Toileting: Independent Bathing: Independent Tub Shower transfers:  Independent no grab bars Equipment: Grab bars  IADLs: Shopping: Independent Light housekeeping:  Independent Meal Prep:  Independent Community mobility: driving Medication management:  Independent Financial management: No change Handwriting: 75% legible: Pt. reports decreased legibility when multitasking   MOBILITY STATUS: Independent  POSTURE COMMENTS:  Pectoral tightness with  rounded shoulders Sitting balance:  I Unsupported static, and dynamic: good  ACTIVITY TOLERANCE: Activity tolerance: WFL  FUNCTIONAL OUTCOME MEASURES:  FOTO: 92 Timed Up, and Go  (TUG) Score: 8 sec. 5 Times sit to stand Score: 12 sec. (Pt. did not  come into a full stand for the last rep. 2/2 foot placement) 6-Minute Walk Test Score: 1875 ft. Berg Balance Scale Score: 56  Freezing of Gait Questionnaire Score: 0  Bilateral  Hand Tremors with the left greater than right. Pt. Reports tremors are worse with dual or multitasking, also when under pressure.   UPPER EXTREMITY ROM:    Active ROM Right Eval WFL Left Eval [place] [facility]  Shoulder flexion    Shoulder abduction    Shoulder adduction    Shoulder extension    Shoulder internal rotation    Shoulder external rotation    Elbow flexion    Elbow extension    Wrist flexion    Wrist extension    Wrist ulnar deviation    Wrist radial deviation    Wrist pronation    Wrist supination    (Blank rows = not tested)  UPPER EXTREMITY MMT:     MMT Right eval Left eval  Shoulder flexion 5/5 5/5  Shoulder abduction 5/5 5/5  Shoulder adduction    Shoulder extension    Shoulder internal rotation    Shoulder external rotation    Middle trapezius    Lower trapezius    Elbow flexion 5/5 5/5  Elbow extension 5/5 5/5  Wrist flexion    Wrist extension 5/5 5/5  Wrist ulnar deviation    Wrist radial deviation    Wrist pronation    Wrist supination    (Blank rows = not tested)  HAND FUNCTION: Grip strength: Right: 112 lbs; Left: 110 lbs, Lateral pinch: Right: 30 lbs, Left: 26 lbs, and 3 point pinch: Right: 23 lbs, Left: 23 lbs  COORDINATION: 9 Hole Peg test: Right: 26 sec; Left: 24 sec  with Left hand tremors noted   SENSATION: Numbness in the bilateral 2nd, 3rd, and 4th digits  EDEMA: None  COGNITION: Overall cognitive status: Within functional limits for tasks assessed  VISION:  WFL for tasks assessed  PERCEPTION: WFL  PRAXIS: WFL   TODAY'S TREATMENT:                                                                                                                              DATE:  10/06/2022 Neuro re-ed: Pt. tolerated pectoral stretches in standing at the wall for  feedback for 3 rounds of 1 min holds. Pectoral stretches were performed to promote posture, and alignment.    LSVT: Patient was seen for LSVT Daily Session Maximal Daily Exercises for facilitation, and coordination of movement. Maximum Sustained Movements are designed to rescale the amplitude of movement output for generalization to daily functional activities. Pt. Performed as follows for 1 set of 10 repetitions each multi-directional sustained movements: 1) Floor  to ceiling  2) Side to side multidirectional   Multi-directional repetitive movements performed in standing and are designed to provide retraining effort needed for sustained muscle activation in tasks. Performed as follows for 1 set of 10 repetitions each of multi-directional repetitive movements: 3) Step and reach forward step 4) Step and reach sideways step   5) Step and reach backwards step 6) Rock and reach forward/backward  7) Rock and reach sideways   Identified Functional Component Tasks include: Sit to stand BIG functional component task with supervision 10 reps; completed 10 reps this date with mod vc for erect standing 2. BIG reciprocal arm swing; mod vc for maximizing R arm swing backward 3. Extending right shoulder to reach for items behind him. 4. Stepping over an obstacle 5. Backing up a step  BIG ambulation:  x15 min with increased cues being required for right arm swing, and to increase amplitude of movements.  Negotiated 1 flight of stairs up and down without handrail, supv, noting good control and good clearance of each step with 1 initial vc for making sure to clear each step with an exaggerated lift of each foot.  Self Care: Introduced finger flicks J85 with good return demo and advised to complete prior to [facility] tasks, during if necessary for warm up of fingers. Participation in handwriting skills with practice writing checks x4, writing name, address, phone number, list of family members.  Graded up with  increased multi-tasking, with pt alternating between writing list of animals and list of colors.  Pt stopped 1x to perform finger flicks without cueing.  Pt required rubber build up grip on pen.    PATIENT EDUCATION: Education details:  Sustained, and multi-directional  Maximal Daily exercises.  Person educated: Patient Education method: Customer service manager Education comprehension: verbalized understanding, returned demonstration, and needs further education  HOME EXERCISE PROGRAM: Maximal daily exercises  GOALS: Goals reviewed with patient? Yes  SHORT TERM GOALS: Target date: 10/19/2022    Pt. Will independently demonstrate the proper techniques for the Maximal Daily Exercise HEP. Baseline: Pt. Currently does not have Goal status: INITIAL  LONG TERM GOALS: Target date: 11/09/2022    Pt. Will increase the FOTO score by 2 points to reflect Pt. perceived improvement with assessment specific ADL, and IADLs. Baseline: FOTO: 92 Goal status: INITIAL  2.  Pt. Will initiate, and sustain bilateral reciprocal arm swing 100% of the time during the 6 min. Walk test to improve posture, and gait performance. Baseline: Eval: Pt. Presents with limited arm swing  during gait Goal status: INITIAL  3.  Pt. Will assume fully upright midline standing posture 100% of the time for all reps performed, and timed on the 5 times sit to stand test. Baseline: Eval: Forward trunk, head and neck posture, the 5th rep Pt. Did not assume full upright midline posture (half stand) Goal status: INITIAL  4.  Pt. Will improve writing skills in order to be able to independently, legibly, and efficiently fill out checks while under pressure, and multitasking.  Baseline: Eval: Pt. Is able to write his name with 75% legibility. Pt. Reports legibility changes, and tremors when writing out checks while under pressure.  Goal status: INITIAL   ASSESSMENT:  CLINICAL IMPRESSION:  Pt reported excessive fatigue  following his first day of maximal daily exercises yesterday.  Pt continues to be motivated with his participation in the program, and was encouraged by OT that with consistent participation, tolerance for exercises should improve.  Overall, pt required mod visual,  tactile, and verbal cues for form, technique, and amplitude of movements during each of the Sustained, and Multidirectional Maximal Daily Exercises.  Pt required intermittent min tactile and vc for increased R arm backward swing during BIG walking.  Handwriting skills addressed before and after Big walking to note changes in legibility when fatigued.  Pt reported that he always requires a built up grip and did utilize during handwriting this session.  Legibility remained good, but pt did require brief rest period in the middle of a handwriting task to perform finger flicks for fluid handwriting following Big walking.  Pt continues to benefit from OT services for LSVT to improve the amplitude of movements, improve posture, and promote safe gait, and overall function during ADLs, and IADL tasks.   PERFORMANCE DEFICITS: in functional skills including ADLs, IADLs, coordination, dexterity, ROM, strength, pain, Fine motor control, Gross motor control, body mechanics, endurance, decreased knowledge of use of DME, and UE functional use, cognitive skills, and psychosocial skills including coping strategies, environmental adaptation, habits, and routines and behaviors.   IMPAIRMENTS: are limiting patient from ADLs, IADLs, and leisure.   CO-MORBIDITIES; may have co-morbidities  that affects occupational performance. Patient will benefit from skilled OT to address above impairments and improve overall function.  MODIFICATION OR ASSISTANCE TO COMPLETE EVALUATION: Min-Moderate modification of tasks or assist with assess necessary to complete an evaluation.  OT OCCUPATIONAL PROFILE AND HISTORY: Detailed assessment: Review of records and additional review of  physical, cognitive, psychosocial history related to current functional performance.  CLINICAL DECISION MAKING: Moderate - several treatment options, min-mod task modification necessary  REHAB POTENTIAL: Good  EVALUATION COMPLEXITY: Moderate    PLAN:  OT FREQUENCY: 4x/week  OT DURATION: 6 weeks  PLANNED INTERVENTIONS: self care/ADL training, therapeutic exercise, therapeutic activity, neuromuscular re-education, manual therapy, passive range of motion, and paraffin, balance training  RECOMMENDED OTHER SERVICES: N/A  CONSULTED AND AGREED WITH PLAN OF CARE: Patient  PLAN FOR NEXT SESSION:  Continues to work on form and technique with the Maximal Daily exercises.   Robert Speller, MS, OTR/L  [facility], OT 10/07/2022, 9:53 AM

## 2022-10-08 ENCOUNTER — Ambulatory Visit: Payer: Medicare HMO

## 2022-10-08 DIAGNOSIS — G20B2 Parkinson's disease with dyskinesia, with fluctuations: Secondary | ICD-10-CM

## 2022-10-08 DIAGNOSIS — R278 Other lack of coordination: Secondary | ICD-10-CM

## 2022-10-08 DIAGNOSIS — M6281 Muscle weakness (generalized): Secondary | ICD-10-CM | POA: Diagnosis not present

## 2022-10-11 NOTE — Therapy (Signed)
OUTPATIENT OCCUPATIONAL THERAPY NEURO LSVT BIG TREATMENT  Patient Name: Robert Fuller MRN: [id number] DOB:Jul 15, 1951, 71 y.o., male Today's Date: 10/11/2022  PCP: Dr. Miguel Aschoff, MD REFERRING PROVIDER: Dr. Jennings Books, MD     OT End of Session - 10/11/22 1334     Visit Number 5    Number of Visits 17    Date for OT Re-Evaluation 11/09/22    OT Start Time 1510    OT Stop Time 1610    OT Time Calculation (min) 60 min    Activity Tolerance Patient tolerated treatment well    Behavior During Therapy [facility] for tasks assessed/performed             Past Medical History:  Diagnosis Date   Barrett's esophagus    Cough    Esophageal reflux    Kidney stone    Past Surgical History:  Procedure Laterality Date   COLONOSCOPY WITH PROPOFOL N/A 07/21/2022   Procedure: COLONOSCOPY WITH PROPOFOL;  Surgeon: [name], MD;  Location: [facility] ENDOSCOPY;  Service: Endoscopy;  Laterality: N/A;   ESOPHAGOGASTRODUODENOSCOPY (EGD) WITH PROPOFOL N/A 04/06/2016   Procedure: ESOPHAGOGASTRODUODENOSCOPY (EGD) WITH PROPOFOL;  Surgeon: Hulen Luster, MD;  Location: [facility] ENDOSCOPY;  Service: Gastroenterology;  Laterality: N/A;   ESOPHAGOGASTRODUODENOSCOPY (EGD) WITH PROPOFOL N/A 07/21/2022   Procedure: ESOPHAGOGASTRODUODENOSCOPY (EGD) WITH PROPOFOL;  Surgeon: [name], MD;  Location: [facility] ENDOSCOPY;  Service: Endoscopy;  Laterality: N/A;   NO PAST SURGERIES     UPPER GI ENDOSCOPY  04/06/16   normal other than esophageal changes present consistent with barretts esophagus, repeat 2019   Patient Active Problem List   Diagnosis Date Noted   ED (erectile dysfunction) of organic origin 08/04/2016   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    SUBJECTIVE STATEMENT:  Pt. reports that he just found out he has Parkinson's disease at his last MD visit a couple of weeks ago. Pt accompanied by: self  PERTINENT HISTORY:   Pt. is a 71 y.o. male who was recently diagnosed with Parkinson's Disease with bilateral hand tremors on 09/14/2022. Pt. has bilateral Carpal Tunnel Syndrome with numbness in digits of his bilateral hands.   PRECAUTIONS: None  WEIGHT BEARING RESTRICTIONS: No  PAIN:  Are you having pain? No  FALLS: Has patient fallen in last 6 months? No  LIVING ENVIRONMENT: Lives with: lives with their spouse Lives in: House/apartment Stairs: Yes: Internal: 3 floors steps; on right going up and on left going up 3 stories Has following equipment at home: Grab bars  PLOF: Independent  PATIENT GOALS: To improve amplitude of movement  OBJECTIVE:   HAND DOMINANCE: Right  ADLs: Overall ADLs:  Independent Transfers/ambulation related to ADLs: Eating: Independent Grooming:  Independent UB Dressing: Independent LB Dressing: Independent Toileting: Independent Bathing: Independent Tub Shower transfers:  Independent no grab bars Equipment: Grab bars  IADLs: Shopping: Independent Light housekeeping:  Independent Meal Prep:  Independent Community mobility: driving Medication management:  Independent Financial management: No change Handwriting: 75% legible: Pt. reports decreased legibility when multitasking   MOBILITY STATUS: Independent  POSTURE  COMMENTS:  Pectoral tightness with rounded shoulders Sitting balance:  I Unsupported static, and dynamic: good  ACTIVITY TOLERANCE: Activity tolerance: WFL  FUNCTIONAL OUTCOME MEASURES:  FOTO: 92 Timed Up, and Go  (TUG) Score: 8 sec. 5 Times sit to stand Score: 12 sec. (Pt. did  not come into a full stand for the last rep. 2/2 foot placement) 6-Minute Walk Test Score: 1875 ft. Berg Balance Scale Score: 56  Freezing of Gait Questionnaire Score: 0  Bilateral  Hand Tremors with the left greater than right. Pt. Reports tremors are worse with dual or multitasking, also when under pressure.   UPPER EXTREMITY ROM:    Active ROM Right Eval WFL Left Eval [facility]  Shoulder flexion    Shoulder abduction    Shoulder adduction    Shoulder extension    Shoulder internal rotation    Shoulder external rotation    Elbow flexion    Elbow extension    Wrist flexion    Wrist extension    Wrist ulnar deviation    Wrist radial deviation    Wrist pronation    Wrist supination    (Blank rows = not tested)  UPPER EXTREMITY MMT:     MMT Right eval Left eval  Shoulder flexion 5/5 5/5  Shoulder abduction 5/5 5/5  Shoulder adduction    Shoulder extension    Shoulder internal rotation    Shoulder external rotation    Middle trapezius    Lower trapezius    Elbow flexion 5/5 5/5  Elbow extension 5/5 5/5  Wrist flexion    Wrist extension 5/5 5/5  Wrist ulnar deviation    Wrist radial deviation    Wrist pronation    Wrist supination    (Blank rows = not tested)  HAND FUNCTION: Grip strength: Right: 112 lbs; Left: 110 lbs, Lateral pinch: Right: 30 lbs, Left: 26 lbs, and 3 point pinch: Right: 23 lbs, Left: 23 lbs  COORDINATION: 9 Hole Peg test: Right: 26 sec; Left: 24 sec  with Left hand tremors noted   SENSATION: Numbness in the bilateral 2nd, 3rd, and 4th digits  EDEMA: None  COGNITION: Overall cognitive status: Within functional limits for tasks assessed  VISION:  WFL for tasks assessed  PERCEPTION: WFL  PRAXIS: WFL   TODAY'S TREATMENT:                                                                                                                              DATE:  10/06/2022  Pt. tolerated pectoral stretches in standing at the wall for feedback.  Pectoral stretches were performed to promote posture, and alignment.  Performed 1 min holds against the wall x3 reps.   LSVT:  Patient was seen for LSVT Daily Session Maximal Daily Exercises for facilitation, and coordination of movement.   Maximum Sustained Movements are designed to rescale the amplitude of movement output for generalization to daily functional activities.Pt. Performed as follows for 1 set of  10 repetitions each multi-directional sustained movements:  1) Floor to ceiling  2) Side to side multidirectional   Multi-directional repetitive movements performed in standing and are designed to provide retraining effort needed for sustained muscle activation in tasks. Performed as follows for 1 set of 10 repetitions each of multi-directional repetitive movements:  3) Step and reach forward step 4) Step and reach sideways step   5) Step and reach backwards step 6) Rock and reach forward/backward  7) Rock and reach sideways   Identified Functional Component Tasks include:  Sit to stand BIG functional component task with supervision 5 reps  2. BIG reciprocal arm swing 3. Extending right shoulder to reach for items behind him. 4. Stepping over an obstacle 5. Writing  Seated marches x3 sets 10 reps each with hands above knees for a target for high amplitude marches. Standing marches with tapping feet onto cone with each march, 3 sets 10 reps each.   Pt. worked on improving the amplitude of bilateral arm swing reaching to grasp pegs at a tabletop surface in front of him, and extending posteriorly to release the pegs in a container behind him.  Pt required intermittent vc to increase amplitude of R arm swing.  Pt. Then worked on bilateral arm swing while standing, and swinging his arms using a wall behind him for feedback, and to encourage increased amplitude of the arm swing especially posteriorly when touching the wall with alternating hands; performed 3 sets 10 reps each.    BIG  ambulation: Big walking and big marching with exaggerated arm swings x10 min.  Intermittent min vc for increasing amplitude of R arm swing posteriorly.  Pt required tactile and visual cues to alternate arms with big walking, as he tended to sequence same side leg and arm, unlike a normal walking pattern.  Handwriting completed after big walking to assess legibility when fatigued.  Pt able to write 7-8 cursive sentences with good fluidity and legibility using wide grip pen.     PATIENT EDUCATION: Education details:  Sustained, and multi-directional  Maximal Daily exercises.  Person educated: Patient Education method: Customer service manager Education comprehension: verbalized understanding, returned demonstration, and needs further education  HOME EXERCISE PROGRAM: Maximal daily exercises; big walking  GOALS: Goals reviewed with patient? Yes  SHORT TERM GOALS: Target date: 10/19/2022    Pt. Will independently demonstrate the proper techniques for the Maximal Daily Exercise HEP. Baseline: Pt. Currently does not have Goal status: INITIAL  LONG TERM GOALS: Target date: 11/09/2022    Pt. Will increase the FOTO score by 2 points to reflect Pt. perceived improvement with assessment specific ADL, and IADLs. Baseline: FOTO: 92 Goal status: INITIAL  2.  Pt. Will initiate, and sustain bilateral reciprocal arm swing 100% of the time during the 6 min. Walk test to improve posture, and gait performance. Baseline: Eval: Pt. Presents with limited arm swing  during gait Goal status: INITIAL  3.  Pt. Will assume fully upright midline standing posture 100% of the time for all reps performed, and timed on the 5 times sit to stand test. Baseline: Eval: Forward trunk, head and neck posture, the 5th rep Pt. Did not assume full upright midline posture (half stand) Goal status: INITIAL  4.  Pt. Will improve writing skills in order to be able to independently, legibly, and efficiently fill out  checks while under pressure, and multitasking.  Baseline: Eval: Pt. Is able to write his name with 75% legibility. Pt. Reports legibility changes, and tremors when  writing out checks while under pressure.  Goal status: INITIAL   ASSESSMENT:  CLINICAL IMPRESSION:   Pt required min verbal cues, tactile cues, and cues for visual demonstration for form, technique, and amplitude of movements during each of the Sustained, and Multidirectional Maximal Daily Exercises. Pt. Required less cuing overall today. Pt. Required cues when dual tasking with counting during the floor to ceiling MDE. Hand/finger flicks completed with the floor to ceiling MDE.  No LOB  noted during the Maximal daily exercises today.  Pt. Requires cues to increase the amplitude of bilateral arm swing during BIG walking, and upright posture, and cues to alternate swinging L arm with R foot forward/R arm with L foot forward during Big walking to simulate normal gait/arm swing pattern.  Pt required intermittent vc to increase amplitude of R posterior arm swing when releasing pegs into container behind him.  Handwriting legibility good today when completed end session when pt was more fatigued.  Pt continues to use finger flicks as needed and built up rubber grip on pen.  Pt. Continues to benefit from OT services for LSVT to improve the amplitude of movements, improve posture, and promote safe gait, and overall function during ADLs, and IADL tasks.   PERFORMANCE DEFICITS: in functional skills including ADLs, IADLs, coordination, dexterity, ROM, strength, pain, Fine motor control, Gross motor control, body mechanics, endurance, decreased knowledge of use of DME, and UE functional use, cognitive skills, and psychosocial skills including coping strategies, environmental adaptation, habits, and routines and behaviors.   IMPAIRMENTS: are limiting patient from ADLs, IADLs, and leisure.   CO-MORBIDITIES; may have co-morbidities  that affects  occupational performance. Patient will benefit from skilled OT to address above impairments and improve overall function.  MODIFICATION OR ASSISTANCE TO COMPLETE EVALUATION: Min-Moderate modification of tasks or assist with assess necessary to complete an evaluation.  OT OCCUPATIONAL PROFILE AND HISTORY: Detailed assessment: Review of records and additional review of physical, cognitive, psychosocial history related to current functional performance.  CLINICAL DECISION MAKING: Moderate - several treatment options, min-mod task modification necessary  REHAB POTENTIAL: Good  EVALUATION COMPLEXITY: Moderate    PLAN:  OT FREQUENCY: 4x/week  OT DURATION: 6 weeks  PLANNED INTERVENTIONS: self care/ADL training, therapeutic exercise, therapeutic activity, neuromuscular re-education, manual therapy, passive range of motion, and paraffin, balance training  RECOMMENDED OTHER SERVICES: N/A  CONSULTED AND AGREED WITH PLAN OF CARE: Patient  PLAN FOR NEXT SESSION:  Continues to work on form and technique with the Maximal Daily exercises.   Leta Speller, MS, OTR/L  Darleene Cleaver, OT 10/11/2022, 1:35 PM

## 2022-10-12 ENCOUNTER — Ambulatory Visit: Payer: Medicare HMO | Admitting: Occupational Therapy

## 2022-10-12 DIAGNOSIS — M6281 Muscle weakness (generalized): Secondary | ICD-10-CM

## 2022-10-12 DIAGNOSIS — G20B2 Parkinson's disease with dyskinesia, with fluctuations: Secondary | ICD-10-CM | POA: Diagnosis not present

## 2022-10-12 DIAGNOSIS — R278 Other lack of coordination: Secondary | ICD-10-CM | POA: Diagnosis not present

## 2022-10-12 IMAGING — MR MR CERVICAL SPINE W/O CM
5 series · 40 of 48 positions shown · non-contrast
Comparison: None.

CLINICAL DATA: Left hand numbness 1 year

EXAM:
MRI CERVICAL SPINE WITHOUT CONTRAST
TECHNIQUE: Multiplanar, multisequence MR imaging of the cervical spine was
performed. No intravenous contrast was administered.

[Series 5: T2 · sagittal · 3.0mm · 0.62mm/px · 7 of 15 slices shown (1 of 2)]
[im 1/15]
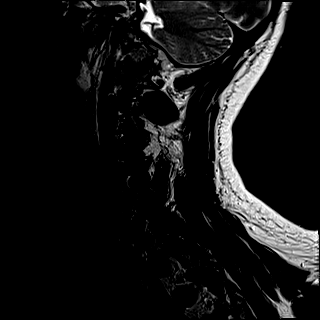
[im 3/15]
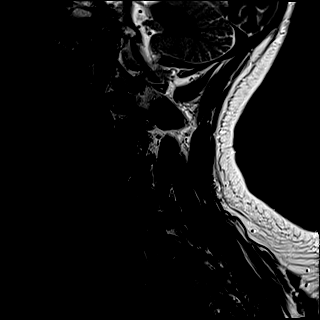
[im 5/15]
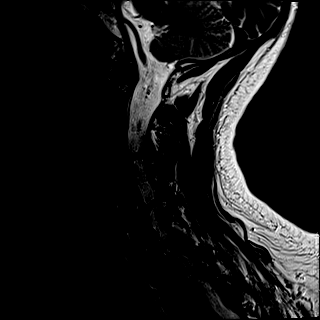
[im 8/15]
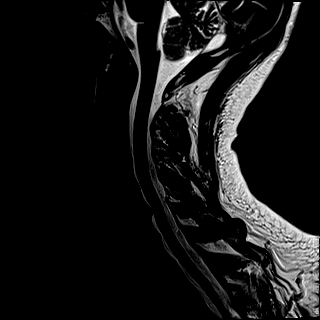
[im 10/15]
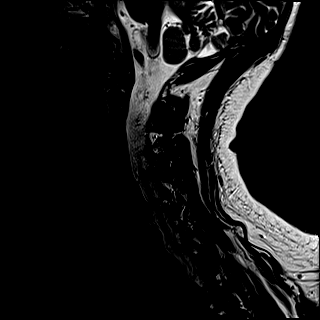
[im 12/15]
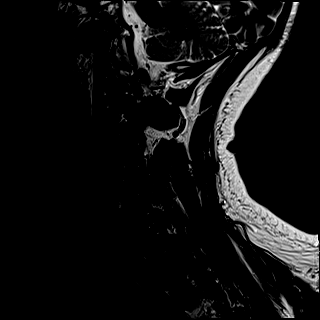
[im 15/15]
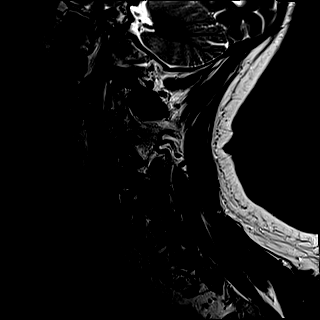

[Series 6: FLAIR · sagittal · 3.0mm · 0.78mm/px · 7 of 15 slices shown]
[im 1/15]
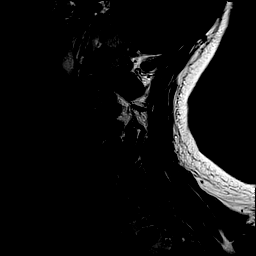
[im 3/15]
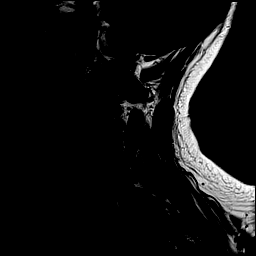
[im 5/15]
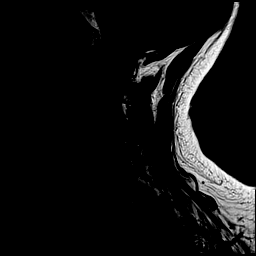
[im 8/15]
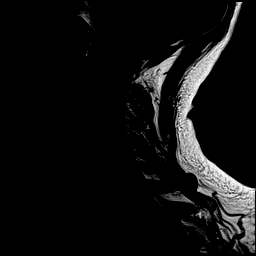
[im 10/15]
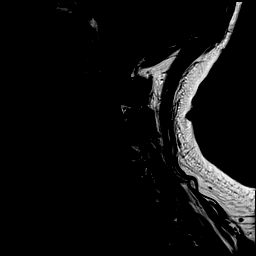
[im 12/15]
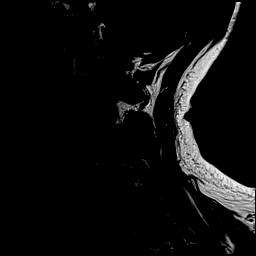
[im 15/15]
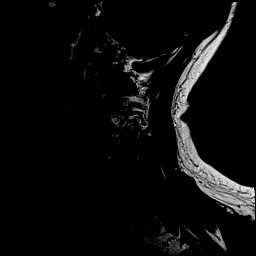

[Series 7: STIR · sagittal · 3.0mm · 0.62mm/px · 6 of 15 slices shown]
[im 1/15]
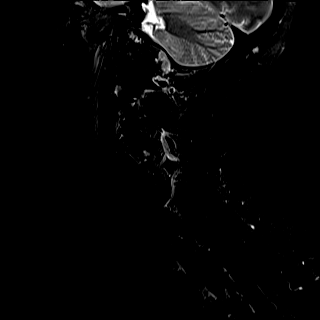
[im 3/15]
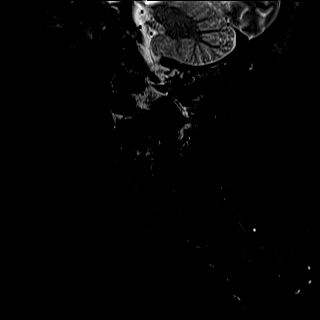
[im 6/15]
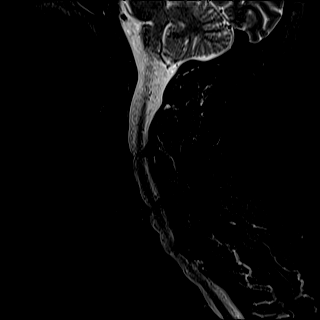
[im 9/15]
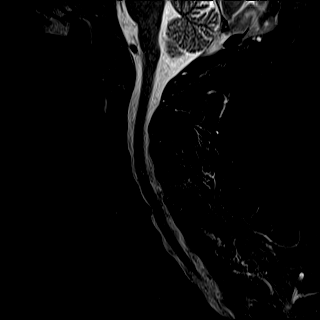
[im 12/15]
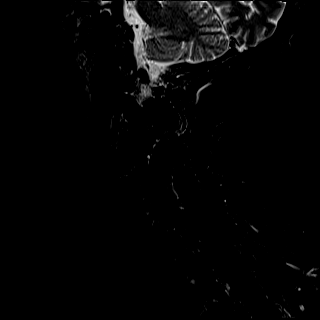
[im 15/15]
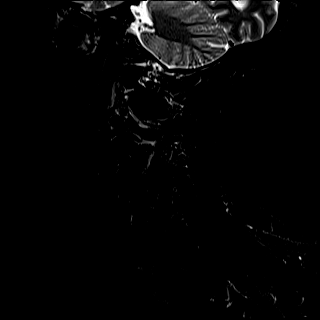

[Series 8: T2 · axial · 3.0mm · 0.70mm/px · z∈[-148,-41]mm · 12 of 33 slices shown (2 of 2)]
[im 1/33]
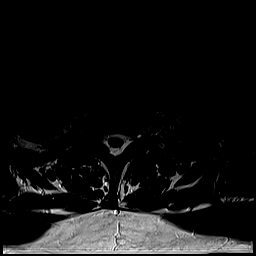
[im 3/33]
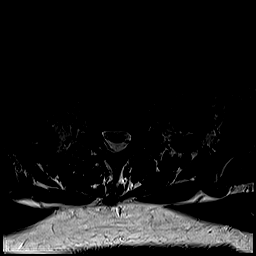
[im 5/33]
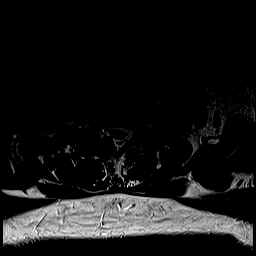
[im 8/33]
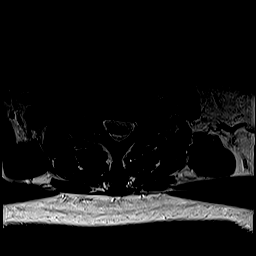
[im 10/33]
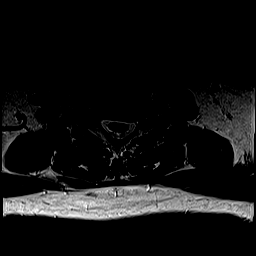
[im 13/33]
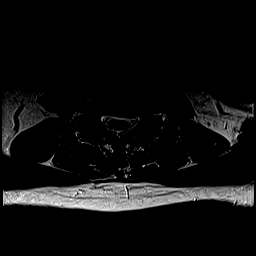
[im 15/33]
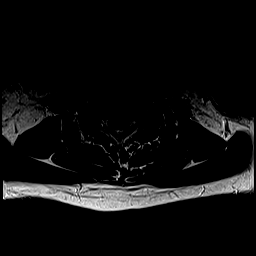
[im 18/33]
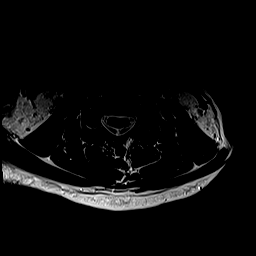
[im 20/33]
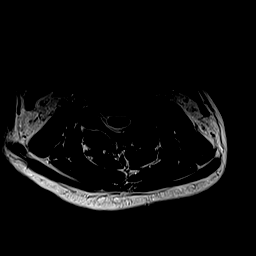
[im 23/33]
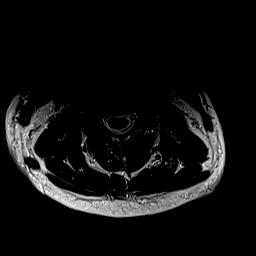
[im 28/33]
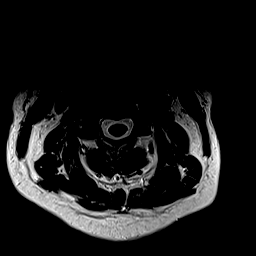
[im 33/33]
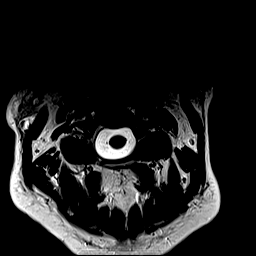

[Series 9: ax mpgr · axial · 3.0mm · 0.35mm/px · z∈[-149,-42]mm · 8 of 33 slices shown]
[im 1/33]
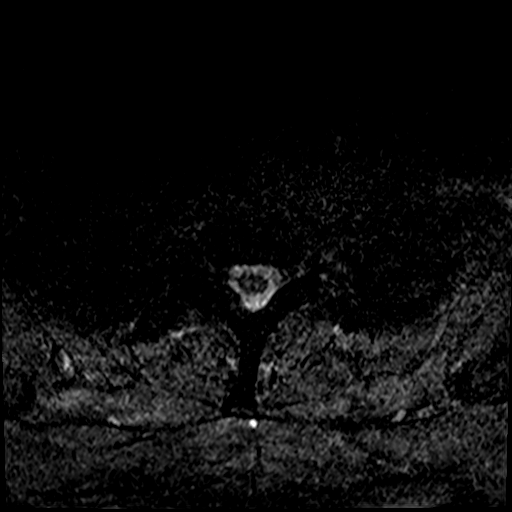
[im 5/33]
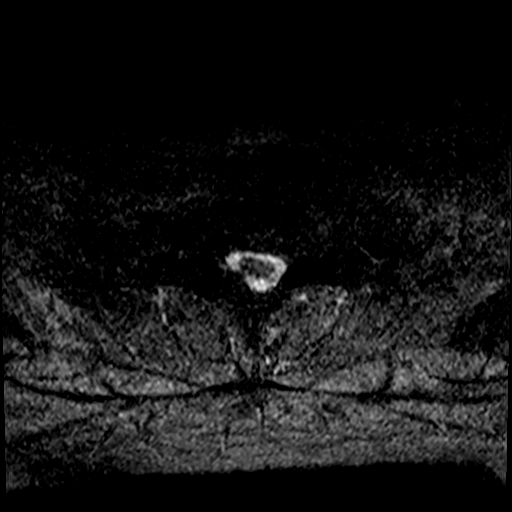
[im 10/33]
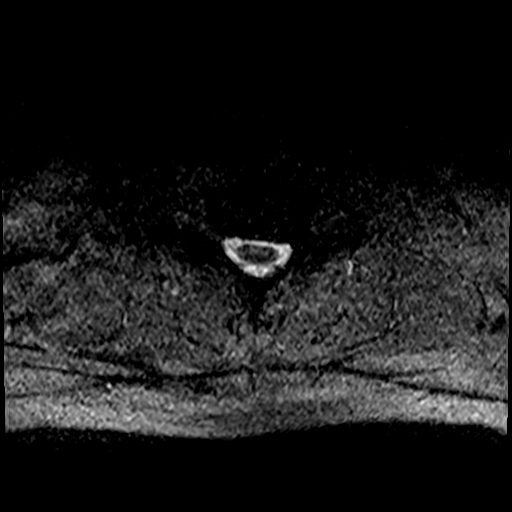
[im 15/33]
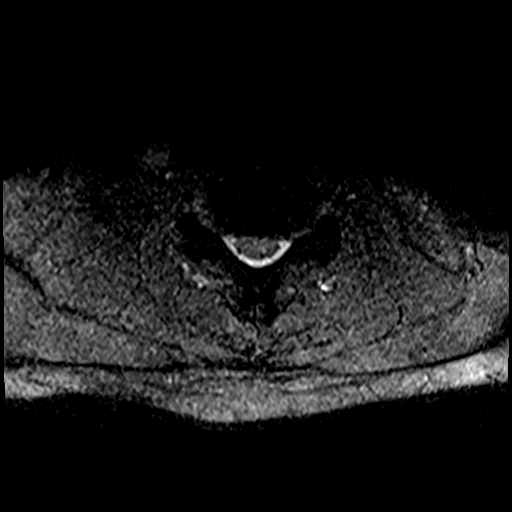
[im 18/33]
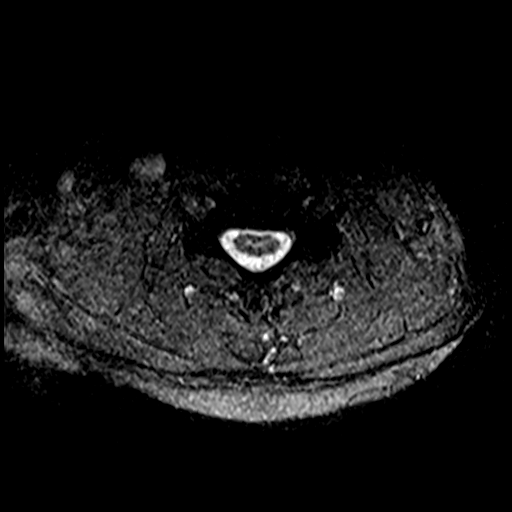
[im 23/33]
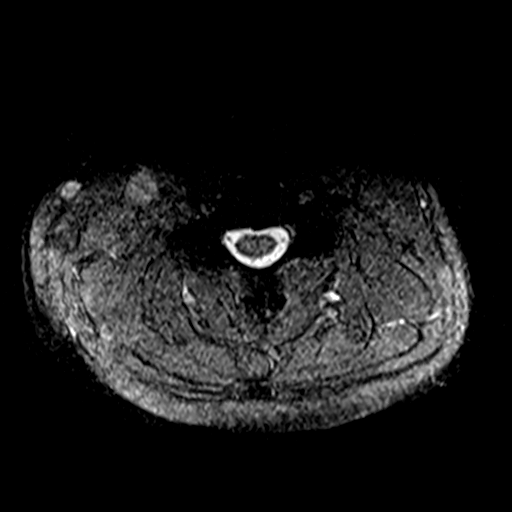
[im 28/33]
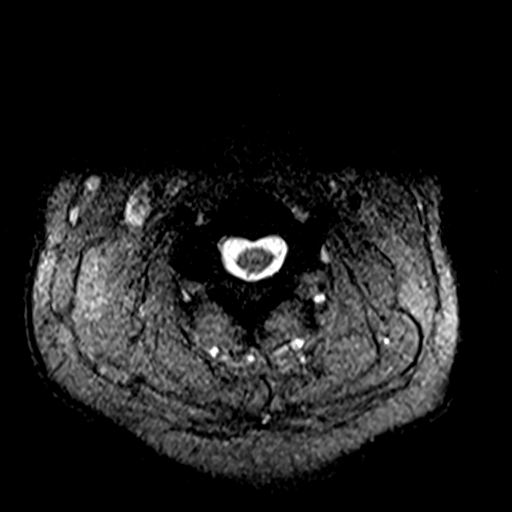
[im 33/33]
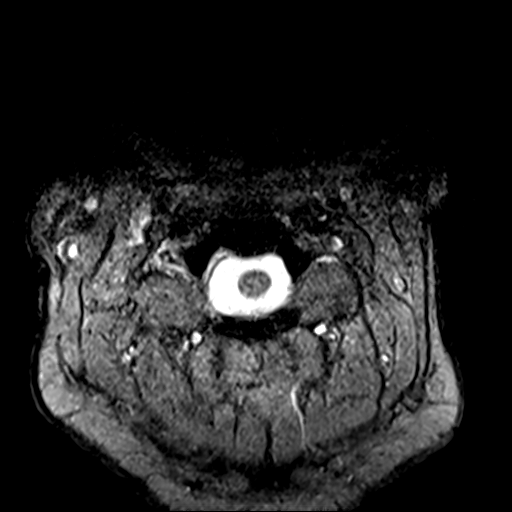

[40 of 48 positions shown; findings below may reference images not displayed]

FINDINGS: Alignment: Mild anterolisthesis C7-T1.  Mild retrolisthesis C5-6.

Vertebrae: Negative for fracture or mass.

Cord: Normal signal and morphology

Posterior Fossa, vertebral arteries, paraspinal tissues: Negative

Disc levels:

C2-3: Mild right foraminal narrowing due to facet hypertrophy.

C3-4: Moderate right foraminal narrowing due to uncinate spurring
and facet hypertrophy. Spinal canal adequate in size.

C4-5: Moderate left foraminal encroachment due to uncinate spurring
and facet hypertrophy. Right foramen patent.

C5-6: Disc degeneration with diffuse uncinate spurring. Cord
flattening with mild spinal stenosis and moderate foraminal stenosis
bilaterally due to spurring.

C6-7: Moderate left foraminal encroachment due to uncinate spurring.
Right foramen patent.

C7-T1: Mild right foraminal narrowing due to facet hypertrophy.
IMPRESSION: Multilevel cervical spine degenerative changes as above

Moderate right foraminal encroachment C3-4 and C4-5.

Disc degeneration and spurring C5-6 with mild spinal stenosis and
moderate foraminal stenosis bilaterally

Moderate left foraminal stenosis at C6-7.

## 2022-10-13 ENCOUNTER — Ambulatory Visit: Payer: Medicare HMO | Admitting: Occupational Therapy

## 2022-10-13 DIAGNOSIS — M6281 Muscle weakness (generalized): Secondary | ICD-10-CM

## 2022-10-13 DIAGNOSIS — R278 Other lack of coordination: Secondary | ICD-10-CM | POA: Diagnosis not present

## 2022-10-13 DIAGNOSIS — G20B2 Parkinson's disease with dyskinesia, with fluctuations: Secondary | ICD-10-CM | POA: Diagnosis not present

## 2022-10-13 NOTE — Therapy (Signed)
OUTPATIENT OCCUPATIONAL THERAPY NEURO TREATMENT  Patient Name: Robert Fuller MRN: [id number] DOB:October 10, 1951, 71 y.o., male Today's Date: 10/13/2022  PCP: Dr. Miguel Aschoff, MD REFERRING PROVIDER: Dr. Jennings Books, MD   OT End of Session - 10/12/2022    Visit Number 6    Number of Visits 17    Date for OT Re-Evaluation 11/09/22    OT Start Time 1500    OT Stop Time 1600    OT Time Calculation (min) 60 min    Behavior During Therapy [facility] for tasks assessed/performed                   OT End of Session - 10/13/22 0847     Visit Number 6    Number of Visits 17    Date for OT Re-Evaluation 11/09/22    OT Start Time 1500    OT Stop Time 1600    OT Time Calculation (min) 60 min    Behavior During Therapy [facility] for tasks assessed/performed             Past Medical History:  Diagnosis Date   Barrett's esophagus    Cough    Esophageal reflux    Kidney stone    Past Surgical History:  Procedure Laterality Date   COLONOSCOPY WITH PROPOFOL N/A 07/21/2022   Procedure: COLONOSCOPY WITH PROPOFOL;  Surgeon: [name], MD;  Location: [facility] ENDOSCOPY;  Service: Endoscopy;  Laterality: N/A;   ESOPHAGOGASTRODUODENOSCOPY (EGD) WITH PROPOFOL N/A 04/06/2016   Procedure: ESOPHAGOGASTRODUODENOSCOPY (EGD) WITH PROPOFOL;  Surgeon: Hulen Luster, MD;  Location: [facility] ENDOSCOPY;  Service: Gastroenterology;  Laterality: N/A;   ESOPHAGOGASTRODUODENOSCOPY (EGD) WITH PROPOFOL N/A 07/21/2022   Procedure: ESOPHAGOGASTRODUODENOSCOPY (EGD) WITH PROPOFOL;  Surgeon: [name], MD;  Location: [facility] ENDOSCOPY;  Service: Endoscopy;  Laterality: N/A;   NO PAST SURGERIES     UPPER GI ENDOSCOPY  04/06/16   normal other than esophageal changes present consistent with barretts esophagus, repeat 2019   Patient Active Problem List   Diagnosis Date Noted   ED (erectile dysfunction) of organic origin 08/04/2016   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 That he went home, and went to bed from fatigue following the first LSVT treatment day  SUBJECTIVE STATEMENT:  Pt. reports that he just found out he has Parkinson's disease at his last MD visit a couple of weeks ago. Pt accompanied by: self  PERTINENT HISTORY:   Pt. is a 71 y.o. male who was recently diagnosed with Parkinson's Disease with bilateral hand tremors on 09/14/2022. Pt. has bilateral Carpal Tunnel Syndrome with numbness in digits of his bilateral hands.   PRECAUTIONS: None  WEIGHT BEARING RESTRICTIONS: No  PAIN:  Are you having pain? No  FALLS: Has patient fallen in last 6 months? No  LIVING ENVIRONMENT: Lives with: lives with their spouse Lives in: House/apartment Stairs: Yes: Internal: 3 floors steps; on right going up and on left going up 3 stories Has following equipment at home: Grab bars  PLOF: Independent  PATIENT GOALS: To improve amplitude of movement  OBJECTIVE:   HAND DOMINANCE: Right  ADLs: Overall ADLs:  Independent Transfers/ambulation related to ADLs: Eating:  Independent Grooming:  Independent UB Dressing: Independent LB Dressing: Independent Toileting: Independent Bathing: Independent Tub Shower transfers:  Independent no grab bars Equipment: Grab bars  IADLs: Shopping: Independent Light housekeeping:  Independent Meal Prep:  Independent Community mobility: driving Medication management:  Independent Financial management: No change Handwriting: 75% legible: Pt. reports decreased legibility when multitasking   MOBILITY STATUS: Independent  POSTURE COMMENTS:  Pectoral tightness with rounded shoulders Sitting balance:  I Unsupported  static, and dynamic: good  ACTIVITY TOLERANCE: Activity tolerance: WFL  FUNCTIONAL OUTCOME MEASURES:  FOTO: 92 Timed Up, and Go  (TUG) Score: 8 sec. 5 Times sit to stand Score: 12 sec. (Pt. did not come into a full stand for the last rep. 2/2 foot placement) 6-Minute Walk Test Score: 1875 ft. Berg Balance Scale Score: 56  Freezing of Gait Questionnaire Score: 0  Bilateral  Hand Tremors with the left greater than right. Pt. Reports tremors are worse with dual or multitasking, also when under pressure.   UPPER EXTREMITY ROM:    Active ROM Right Eval WFL Left Eval [facility]  Shoulder flexion    Shoulder abduction    Shoulder adduction    Shoulder extension    Shoulder internal rotation    Shoulder external rotation    Elbow flexion    Elbow extension    Wrist flexion    Wrist extension    Wrist ulnar deviation    Wrist radial deviation    Wrist pronation    Wrist supination    (Blank rows = not tested)  UPPER EXTREMITY MMT:     MMT Right eval Left eval  Shoulder flexion 5/5 5/5  Shoulder abduction 5/5 5/5  Shoulder adduction    Shoulder extension    Shoulder internal rotation    Shoulder external rotation    Middle trapezius    Lower trapezius    Elbow flexion 5/5 5/5  Elbow extension 5/5 5/5  Wrist flexion    Wrist extension 5/5 5/5  Wrist ulnar deviation    Wrist radial deviation    Wrist pronation    Wrist supination    (Blank rows = not tested)  HAND FUNCTION: Grip strength: Right: 112 lbs; Left: 110 lbs, Lateral pinch: Right: 30 lbs, Left: 26 lbs, and 3 point pinch: Right: 23 lbs, Left: 23 lbs  COORDINATION: 9 Hole Peg test: Right: 26 sec; Left: 24 sec  with Left hand tremors noted   SENSATION: Numbness in the bilateral 2nd, 3rd, and 4th digits  EDEMA: None  COGNITION: Overall cognitive status: Within functional limits for tasks assessed  VISION:  WFL for tasks assessed  PERCEPTION: WFL  PRAXIS: WFL   TODAY'S TREATMENT:                                                                                                                               DATE:  10/06/2022  Pt. tolerated pectoral stretches in standing at the wall for feedback. Pectoral stretches  were performed to promote posture, and alignment.    LSVT:  Patient was seen for LSVT Daily Session Maximal Daily Exercises for facilitation, and coordination of movement.   Maximum Sustained Movements are designed to rescale the amplitude of movement output for generalization to daily functional activities.Pt. Performed as follows for 1 set of 10 repetitions each multi-directional sustained movements:  1) Floor to ceiling  2) Side to side multidirectional   Multi-directional repetitive movements performed in standing and are designed to provide retraining effort needed for sustained muscle activation in tasks. Performed as follows for 1 set of 10 repetitions each of multi-directional repetitive movements:  3) Step and reach forward step 4) Step and reach sideways step   5) Step and reach backwards step 6) Rock and reach forward/backward  7) Rock and reach sideways   Identified Functional Component Tasks include:  Sit to stand BIG functional component task with supervision 5 reps  2. BIG reciprocal arm swing 3. Extending right shoulder to reach for items behind him. 4. Stepping over an obstacle 5. Writing  Pt. worked on Improving the amplitude of bilateral arm swing reaching to grasp pegs at a tabletop surface in front of him, and extending posteriorly to place the pegs in a container behind him 1# wrist cuff weights were utilized.   BIG ambulation:    1155 ft. With increased cues being required for right arm swing, and to increase amplitude of movements. Pt. Worked on BIG walking, and BIG marching with cues for amplitude of movements.   Pt. required verbal cues, tactile cues, and cues for visual demonstration for form, technique, and amplitude of movements during  each of the Sustained, and Multidirectional Maximal Daily Exercises. Pt. Required cues when dual tasking with counting during the floor to ceiling MDE. Pt. Required additional cues for counting when adding directional changes during the exercises. Hand/finger flicks for floor to ceiling MDE.   No LOB  noted during the Maximal daily exercises today.  Pt. Continues to require cues to increase the amplitude of bilateral arm swing during BIG walking, and upright posture. Added alternating direction change to increase challenge for Side to side, forward step and reach, and sideways step and reach. Pt. Presented with LOB during backward step and reach with the right. However, was able to independently self-correct each time. Pt. Required max cues for proper techniques, and arm movements. Pt. Continues to benefit from OT services for LSVT to improve the amplitude of movements, improve posture, and promote safe gait, and overall function during ADLs, and IADL tasks.       PATIENT EDUCATION: Education details:  Sustained, and multi-directional  Maximal Daily exercises.  Person educated: Patient Education method: Customer service manager Education comprehension: verbalized understanding, returned demonstration, and needs further education  HOME EXERCISE PROGRAM:     GOALS: Goals reviewed with patient? Yes  SHORT TERM GOALS: Target date: 10/19/2022    Pt. Will independently demonstrate the proper techniques for the Maximal Daily Exercise HEP. Baseline: Pt. Currently does not have Goal status: INITIAL  LONG TERM GOALS: Target date: 11/09/2022    Pt. Will increase the FOTO score by 2 points to reflect Pt. perceived improvement with assessment specific ADL, and IADLs. Baseline: FOTO: 92 Goal status: INITIAL  2.  Pt. Will initiate, and sustain bilateral reciprocal arm swing 100% of the time during the 6 min. Walk test to improve posture, and gait performance. Baseline: Eval: Pt. Presents  with limited arm swing  during gait Goal status: INITIAL  3.  Pt. Will assume fully upright midline standing posture 100% of the time for all reps performed, and timed on the 5 times sit to stand test. Baseline: Eval: Forward trunk, head and neck posture, the 5th rep Pt. Did not assume full upright midline posture (half stand) Goal status: INITIAL  4.  Pt. Will improve writing skills in order to be able to independently, legibly, and efficiently fill out checks while under pressure, and multitasking.  Baseline: Eval: Pt. Is able to write his name with 75% legibility. Pt. Reports legibility changes, and tremors when writing out checks while under pressure.  Goal status: INITIAL   ASSESSMENT:  CLINICAL IMPRESSION:   Pt. required verbal cues, tactile cues, and cues for visual demonstration for form, technique, and amplitude of movements during each of the Sustained, and Multidirectional Maximal Daily Exercises. Pt. Required cues when dual tasking with counting during the floor to ceiling MDE. Hand/finger flicks for floor to ceiling MDE.   No LOB  noted during the Maximal daily exercises today.  Pt. Continues to require cues to increase the amplitude of bilateral arm swing during BIG walking, and upright posture. Added alternating direction change to increase challenge for Side to side, forward step and reach, and sideways step and reach. Pt. Presented with LOB during backward step and reach with the right. However, was able to independently self-correct each time. Pt. Required max cues for proper techniques, and arm movements. Pt. Required cues for counting during the direction change. Pt. Continues to benefit from OT services for LSVT to improve the amplitude of movements, improve posture, and promote safe gait, and overall function during ADLs, and IADL tasks.   PERFORMANCE DEFICITS: in functional skills including ADLs, IADLs, coordination, dexterity, ROM, strength, pain, Fine motor control, Gross  motor control, body mechanics, endurance, decreased knowledge of use of DME, and UE functional use, cognitive skills, and psychosocial skills including coping strategies, environmental adaptation, habits, and routines and behaviors.   IMPAIRMENTS: are limiting patient from ADLs, IADLs, and leisure.   CO-MORBIDITIES; may have co-morbidities  that affects occupational performance. Patient will benefit from skilled OT to address above impairments and improve overall function.  MODIFICATION OR ASSISTANCE TO COMPLETE EVALUATION: Min-Moderate modification of tasks or assist with assess necessary to complete an evaluation.  OT OCCUPATIONAL PROFILE AND HISTORY: Detailed assessment: Review of records and additional review of physical, cognitive, psychosocial history related to current functional performance.  CLINICAL DECISION MAKING: Moderate - several treatment options, min-mod task modification necessary  REHAB POTENTIAL: Good  EVALUATION COMPLEXITY: Moderate    PLAN:  OT FREQUENCY: 4x/week  OT DURATION: 6 weeks  PLANNED INTERVENTIONS: self care/ADL training, therapeutic exercise, therapeutic activity, neuromuscular re-education, manual therapy, passive range of motion, and paraffin, balance training  RECOMMENDED OTHER SERVICES: N/A  CONSULTED AND AGREED WITH PLAN OF CARE: Patient  PLAN FOR NEXT SESSION:  Continues to work on form and technique with the Maximal Daily exercises.   Harrel Carina, MS, OTR/L   Harrel Carina, OT 10/13/2022, 8:52 AM

## 2022-10-13 NOTE — Therapy (Signed)
OUTPATIENT OCCUPATIONAL THERAPY NEURO TREATMENT  Patient Name: Robert Fuller MRN: [id number] DOB:1951/09/06, 71 y.o., male Today's Date: 10/13/2022  PCP: Dr. Miguel Aschoff, MD REFERRING PROVIDER: Dr. Jennings Books, MD    OT End of Session - 10/13/22 1656     Visit Number 7    Number of Visits 17    Date for OT Re-Evaluation 11/09/22    OT Start Time 1601    OT Stop Time 1650    OT Time Calculation (min) 43 min    Activity Tolerance Patient tolerated treatment well    Behavior During Therapy [facility] for tasks assessed/performed                         Past Medical History:  Diagnosis Date   Barrett's esophagus    Cough    Esophageal reflux    Kidney stone    Past Surgical History:  Procedure Laterality Date   COLONOSCOPY WITH PROPOFOL N/A 07/21/2022   Procedure: COLONOSCOPY WITH PROPOFOL;  Surgeon: [name], MD;  Location: [facility] ENDOSCOPY;  Service: Endoscopy;  Laterality: N/A;   ESOPHAGOGASTRODUODENOSCOPY (EGD) WITH PROPOFOL N/A 04/06/2016   Procedure: ESOPHAGOGASTRODUODENOSCOPY (EGD) WITH PROPOFOL;  Surgeon: Hulen Luster, MD;  Location: [facility] ENDOSCOPY;  Service: Gastroenterology;  Laterality: N/A;   ESOPHAGOGASTRODUODENOSCOPY (EGD) WITH PROPOFOL N/A 07/21/2022   Procedure: ESOPHAGOGASTRODUODENOSCOPY (EGD) WITH PROPOFOL;  Surgeon: [name], MD;  Location: [facility] ENDOSCOPY;  Service: Endoscopy;  Laterality: N/A;   NO PAST SURGERIES     UPPER GI ENDOSCOPY  04/06/16   normal other than esophageal changes present consistent with barretts esophagus, repeat 2019   Patient Active Problem List   Diagnosis Date Noted   ED (erectile dysfunction) of organic origin 08/04/2016   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 Reports having a rezoning planning meeting today.  SUBJECTIVE STATEMENT:  Pt. reports that he just found out he has Parkinson's disease at his last MD visit a couple of weeks ago. Pt accompanied by: self  PERTINENT HISTORY:   Pt. is a 71 y.o. male who was recently diagnosed with Parkinson's Disease with bilateral hand tremors on 09/14/2022. Pt. has bilateral Carpal Tunnel Syndrome with numbness in digits of his bilateral hands.   PRECAUTIONS: None  WEIGHT BEARING RESTRICTIONS: No  PAIN:  Are you having pain? No  FALLS: Has patient fallen in last 6 months? No  LIVING ENVIRONMENT: Lives with: lives with their spouse Lives in: House/apartment Stairs: Yes: Internal: 3 floors steps; on right going up and on left going up 3 stories Has following equipment at home: Grab bars  PLOF: Independent  PATIENT GOALS: To improve amplitude of movement  OBJECTIVE:   HAND DOMINANCE: Right  ADLs: Overall ADLs:  Independent Transfers/ambulation related to ADLs: Eating: Independent Grooming:  Independent UB Dressing: Independent LB Dressing: Independent Toileting: Independent Bathing: Independent Tub Shower transfers:  Independent no grab bars Equipment: Grab bars  IADLs: Shopping: Independent Light housekeeping:  Independent Meal Prep:  Independent Community mobility: driving Medication management:  Independent Financial management: No change Handwriting: 75% legible: Pt. reports decreased legibility when multitasking   MOBILITY STATUS: Independent  POSTURE COMMENTS:  Pectoral tightness with rounded shoulders Sitting balance:  I Unsupported static, and dynamic: good  ACTIVITY TOLERANCE: Activity tolerance: WFL  FUNCTIONAL OUTCOME MEASURES:  FOTO: 92 Timed Up, and Go  (TUG) Score: 8 sec. 5 Times sit to stand Score: 12 sec. (Pt. did not come into a full stand for the last rep.  2/2 foot placement) 6-Minute Walk Test Score: 1875 ft. Berg Balance Scale Score: 56  Freezing of Gait Questionnaire Score: 0  Bilateral  Hand Tremors with the left greater than right. Pt. Reports tremors are worse with dual or multitasking, also when under pressure.   UPPER EXTREMITY ROM:    Active ROM Right Eval WFL Left Eval [facility]  Shoulder flexion    Shoulder abduction    Shoulder adduction    Shoulder extension    Shoulder internal rotation    Shoulder external rotation    Elbow flexion    Elbow extension    Wrist flexion    Wrist extension    Wrist ulnar deviation    Wrist radial deviation    Wrist pronation    Wrist supination    (Blank rows = not tested)  UPPER EXTREMITY MMT:     MMT Right eval Left eval  Shoulder flexion 5/5 5/5  Shoulder abduction 5/5 5/5  Shoulder adduction    Shoulder extension    Shoulder internal rotation    Shoulder external rotation    Middle trapezius    Lower trapezius    Elbow flexion 5/5 5/5  Elbow extension 5/5 5/5  Wrist flexion    Wrist extension 5/5 5/5  Wrist ulnar deviation    Wrist radial deviation    Wrist pronation    Wrist supination    (Blank rows = not tested)  HAND FUNCTION: Grip strength: Right: 112 lbs; Left: 110 lbs, Lateral pinch: Right: 30 lbs, Left: 26 lbs, and 3 point pinch: Right: 23 lbs, Left: 23 lbs  COORDINATION: 9 Hole Peg test: Right: 26 sec; Left: 24 sec  with Left hand tremors noted   SENSATION: Numbness in the bilateral 2nd, 3rd, and 4th digits  EDEMA: None  COGNITION: Overall cognitive status: Within functional limits for tasks assessed  VISION:  WFL for tasks assessed  PERCEPTION: WFL  PRAXIS: WFL   TODAY'S TREATMENT:                                                                                                                              DATE:  10/13/2022  Pt. tolerated pectoral stretches in standing at the wall for feedback. Pectoral stretches were performed to promote  posture, and alignment.    LSVT:  Patient was seen for LSVT Daily Session Maximal Daily Exercises for facilitation, and coordination of movement.   Maximum Sustained Movements are designed to rescale the amplitude of movement output for generalization to daily functional activities.Pt. Performed as follows for 1 set of 10 repetitions each multi-directional sustained movements:  1) Floor to ceiling  2) Side to side multidirectional   Multi-directional repetitive movements performed in standing and are designed to provide retraining effort needed for sustained muscle activation in tasks. Performed as follows for 1 set of 10 repetitions each of multi-directional repetitive movements:  3) Step and reach forward step 4) Step and reach sideways step   5) Step and reach backwards step 6) Rock and reach forward/backward  7) Rock and reach sideways   Identified Functional Component Tasks include:  Sit to stand BIG functional component task with supervision 5 reps  2. BIG reciprocal arm swing 3. Extending right shoulder to reach for items behind him. 4. Stepping over an obstacle 5. Writing  Pt. worked on Improving the amplitude of bilateral arm swing holding bilateral 2# hand weight, alternating arm swing with the end point to the wall for feedback.   Pt. worked on writing 4 sentences at a Designer, industrial/product while standing. Pt. worked on Lexicographer through reps of sideways figure "8's" at the top of the vertical white board. Pt. Initially presented with difficulty motor planning through formulating sideways figure "8's".  The session was shortened today, as Pt. had a previous engagement already scheduled this afternoon. Pt. required fewer verbal cues, tactile cues, and cues for visual demonstration for form, technique, and amplitude of movements during each of the Sustained, and Multidirectional Maximal Daily Exercises. Pt. Continues to require cues when dual tasking with counting during  the floor to ceiling MDE. Pt. Required cues for counting when adding directional changes during the exercises. Hand/finger flicks for floor to ceiling MDE.  No LOB  noted during the Maximal daily exercises today.  Pt. Continues to require cues to increase the amplitude of bilateral arm swing during BIG walking, and upright posture. Pt. Performed alternating direction change to increase challenge for Side to side, forward step and reach, and sideways step and reach. Pt. Presented with less episodes of LOB during backward step and reach with the right. Pt. required max cues for proper technique, and increased amplitude of arm movements. Pt. Continues to benefit from OT services for LSVT to improve the amplitude of movements, improve posture, and promote safe gait, and overall function during ADLs, and IADL tasks.       PATIENT EDUCATION: Education details:  Sustained, and multi-directional  Maximal Daily exercises.  Person educated: Patient Education method: Customer service manager Education comprehension: verbalized understanding, returned demonstration, and needs further education  HOME EXERCISE PROGRAM:     GOALS: Goals reviewed with patient? Yes  SHORT TERM GOALS: Target date: 10/19/2022    Pt. Will independently demonstrate the proper techniques for the Maximal Daily Exercise HEP. Baseline: Pt. Currently does not have Goal status: INITIAL  LONG TERM GOALS: Target date: 11/09/2022    Pt. Will increase the FOTO score by 2 points to reflect Pt. perceived improvement with assessment specific ADL, and IADLs. Baseline: FOTO: 92 Goal status: INITIAL  2.  Pt. Will initiate, and sustain bilateral reciprocal arm swing 100% of the time during the 6 min. Walk test to improve posture, and gait performance. Baseline: Eval: Pt. Presents with limited arm swing  during gait Goal status: INITIAL  3.  Pt. Will assume fully upright midline standing posture 100% of the time for all reps  performed, and timed on the 5 times sit to stand test. Baseline: Eval: Forward trunk, head and neck posture, the 5th rep Pt. Did not assume full upright midline posture (half stand) Goal status: INITIAL  4.  Pt. Will improve writing  skills in order to be able to independently, legibly, and efficiently fill out checks while under pressure, and multitasking.  Baseline: Eval: Pt. Is able to write his name with 75% legibility. Pt. Reports legibility changes, and tremors when writing out checks while under pressure.  Goal status: INITIAL   ASSESSMENT:  CLINICAL IMPRESSION:   The session was shortened today, as Pt. had a previous engagement already scheduled this afternoon. Pt. required fewer verbal cues, tactile cues, and cues for visual demonstration for form, technique, and amplitude of movements during each of the Sustained, and Multidirectional Maximal Daily Exercises. Pt. Continues to require cues when dual tasking with counting during the floor to ceiling MDE. Pt. Required cues for counting when adding directional changes during the exercises. Hand/finger flicks for floor to ceiling MDE.  No LOB  noted during the Maximal daily exercises today.  Pt. Continues to require cues to increase the amplitude of bilateral arm swing during BIG walking, and upright posture. Pt. Performed alternating direction change to increase challenge for Side to side, forward step and reach, and sideways step and reach. Pt. Presented with less episodes of LOB during backward step and reach with the right. Pt. required max cues for proper technique, and increased amplitude of arm movements. Pt. Continues to benefit from OT services for LSVT to improve the amplitude of movements, improve posture, and promote safe gait, and overall function during ADLs, and IADL tasks.     PERFORMANCE DEFICITS: in functional skills including ADLs, IADLs, coordination, dexterity, ROM, strength, pain, Fine motor control, Gross motor control,  body mechanics, endurance, decreased knowledge of use of DME, and UE functional use, cognitive skills, and psychosocial skills including coping strategies, environmental adaptation, habits, and routines and behaviors.   IMPAIRMENTS: are limiting patient from ADLs, IADLs, and leisure.   CO-MORBIDITIES; may have co-morbidities  that affects occupational performance. Patient will benefit from skilled OT to address above impairments and improve overall function.  MODIFICATION OR ASSISTANCE TO COMPLETE EVALUATION: Min-Moderate modification of tasks or assist with assess necessary to complete an evaluation.  OT OCCUPATIONAL PROFILE AND HISTORY: Detailed assessment: Review of records and additional review of physical, cognitive, psychosocial history related to current functional performance.  CLINICAL DECISION MAKING: Moderate - several treatment options, min-mod task modification necessary  REHAB POTENTIAL: Good  EVALUATION COMPLEXITY: Moderate    PLAN:  OT FREQUENCY: 4x/week  OT DURATION: 6 weeks  PLANNED INTERVENTIONS: self care/ADL training, therapeutic exercise, therapeutic activity, neuromuscular re-education, manual therapy, passive range of motion, and paraffin, balance training  RECOMMENDED OTHER SERVICES: N/A  CONSULTED AND AGREED WITH PLAN OF CARE: Patient  PLAN FOR NEXT SESSION:  Continues to work on form and technique with the Maximal Daily exercises.   Harrel Carina, MS, OTR/L   Harrel Carina, OT 10/13/2022, 8:52 AM

## 2022-10-14 ENCOUNTER — Ambulatory Visit: Payer: Medicare HMO | Admitting: Occupational Therapy

## 2022-10-14 DIAGNOSIS — R278 Other lack of coordination: Secondary | ICD-10-CM

## 2022-10-14 DIAGNOSIS — M6281 Muscle weakness (generalized): Secondary | ICD-10-CM | POA: Diagnosis not present

## 2022-10-14 DIAGNOSIS — G20B2 Parkinson's disease with dyskinesia, with fluctuations: Secondary | ICD-10-CM | POA: Diagnosis not present

## 2022-10-14 NOTE — Therapy (Signed)
OUTPATIENT OCCUPATIONAL THERAPY NEURO TREATMENT  Patient Name: Robert Fuller MRN: [id number] DOB:1950/12/30, 71 y.o., male Today's Date: 10/13/2022  PCP: Dr. Miguel Aschoff, MD REFERRING PROVIDER: Dr. Jennings Books, MD    OT End of Session - 10/13/22 1656     Visit Number 7    Number of Visits 17    Date for OT Re-Evaluation 11/09/22    OT Start Time 1696    OT Stop Time 1650    OT Time Calculation (min) 43 min    Activity Tolerance Patient tolerated treatment well    Behavior During Therapy [facility] for tasks assessed/performed                         Past Medical History:  Diagnosis Date   Barrett's esophagus    Cough    Esophageal reflux    Kidney stone    Past Surgical History:  Procedure Laterality Date   COLONOSCOPY WITH PROPOFOL N/A 07/21/2022   Procedure: COLONOSCOPY WITH PROPOFOL;  Surgeon: [name], MD;  Location: [facility] ENDOSCOPY;  Service: Endoscopy;  Laterality: N/A;   ESOPHAGOGASTRODUODENOSCOPY (EGD) WITH PROPOFOL N/A 04/06/2016   Procedure: ESOPHAGOGASTRODUODENOSCOPY (EGD) WITH PROPOFOL;  Surgeon: Hulen Luster, MD;  Location: [facility] ENDOSCOPY;  Service: Gastroenterology;  Laterality: N/A;   ESOPHAGOGASTRODUODENOSCOPY (EGD) WITH PROPOFOL N/A 07/21/2022   Procedure: ESOPHAGOGASTRODUODENOSCOPY (EGD) WITH PROPOFOL;  Surgeon: [name], MD;  Location: [facility] ENDOSCOPY;  Service: Endoscopy;  Laterality: N/A;   NO PAST SURGERIES     UPPER GI ENDOSCOPY  04/06/16   normal other than esophageal changes present consistent with barretts esophagus, repeat 2019   Patient Active Problem List   Diagnosis Date Noted   ED (erectile dysfunction) of organic origin 08/04/2016   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 Reports having a rezoning planning meeting today.  SUBJECTIVE STATEMENT:  Pt. reports that he just found out he has Parkinson's disease at his last MD visit a couple of weeks ago. Pt accompanied by: self  PERTINENT HISTORY:   Pt. is a 71 y.o. male who was recently diagnosed with Parkinson's Disease with bilateral hand tremors on 09/14/2022. Pt. has bilateral Carpal Tunnel Syndrome with numbness in digits of his bilateral hands.   PRECAUTIONS: None  WEIGHT BEARING RESTRICTIONS: No  PAIN:  Are you having pain? No  FALLS: Has patient fallen in last 6 months? No  LIVING ENVIRONMENT: Lives with: lives with their spouse Lives in: House/apartment Stairs: Yes: Internal: 3 floors steps; on right going up and on left going up 3 stories Has following equipment at home: Grab bars  PLOF: Independent  PATIENT GOALS: To improve amplitude of movement  OBJECTIVE:   HAND DOMINANCE: Right  ADLs: Overall ADLs:  Independent Transfers/ambulation related to ADLs: Eating: Independent Grooming:  Independent UB Dressing: Independent LB Dressing: Independent Toileting: Independent Bathing: Independent Tub Shower transfers:  Independent no grab bars Equipment: Grab bars  IADLs: Shopping: Independent Light housekeeping:  Independent Meal Prep:  Independent Community mobility: driving Medication management:  Independent Financial management: No change Handwriting: 75% legible: Pt. reports decreased legibility when multitasking   MOBILITY STATUS: Independent  POSTURE COMMENTS:  Pectoral tightness with rounded shoulders Sitting balance:  I Unsupported static, and dynamic: good  ACTIVITY TOLERANCE: Activity tolerance: WFL  FUNCTIONAL OUTCOME MEASURES:  FOTO: 92 Timed Up, and Go  (TUG) Score: 8 sec. 5 Times sit to stand Score: 12 sec. (Pt. did not come into a full stand for the last rep.  2/2 foot placement) 6-Minute Walk Test Score: 1875 ft. Berg Balance Scale Score: 56  Freezing of Gait Questionnaire Score: 0  Bilateral  Hand Tremors with the left greater than right. Pt. Reports tremors are worse with dual or multitasking, also when under pressure.   UPPER EXTREMITY ROM:    Active ROM Right Eval WFL Left Eval [facility]  Shoulder flexion    Shoulder abduction    Shoulder adduction    Shoulder extension    Shoulder internal rotation    Shoulder external rotation    Elbow flexion    Elbow extension    Wrist flexion    Wrist extension    Wrist ulnar deviation    Wrist radial deviation    Wrist pronation    Wrist supination    (Blank rows = not tested)  UPPER EXTREMITY MMT:     MMT Right eval Left eval  Shoulder flexion 5/5 5/5  Shoulder abduction 5/5 5/5  Shoulder adduction    Shoulder extension    Shoulder internal rotation    Shoulder external rotation    Middle trapezius    Lower trapezius    Elbow flexion 5/5 5/5  Elbow extension 5/5 5/5  Wrist flexion    Wrist extension 5/5 5/5  Wrist ulnar deviation    Wrist radial deviation    Wrist pronation    Wrist supination    (Blank rows = not tested)  HAND FUNCTION: Grip strength: Right: 112 lbs; Left: 110 lbs, Lateral pinch: Right: 30 lbs, Left: 26 lbs, and 3 point pinch: Right: 23 lbs, Left: 23 lbs  COORDINATION: 9 Hole Peg test: Right: 26 sec; Left: 24 sec  with Left hand tremors noted   SENSATION: Numbness in the bilateral 2nd, 3rd, and 4th digits  EDEMA: None  COGNITION: Overall cognitive status: Within functional limits for tasks assessed  VISION:  WFL for tasks assessed  PERCEPTION: WFL  PRAXIS: WFL   TODAY'S TREATMENT:                                                                                                                              DATE:  10/13/2022  Pt. tolerated pectoral stretches in standing at the wall for feedback. Pectoral stretches were performed to promote  posture, and alignment.    LSVT:  Patient was seen for LSVT Daily Session Maximal Daily Exercises for facilitation, and coordination of movement.   Maximum Sustained Movements are designed to rescale the amplitude of movement output for generalization to daily functional activities.Pt. Performed as follows for 1 set of 10 repetitions each multi-directional sustained movements:  1) Floor to ceiling  2) Side to side multidirectional   Multi-directional repetitive movements performed in standing and are designed to provide retraining effort needed for sustained muscle activation in tasks. Performed as follows for 1 set of 10 repetitions each of multi-directional repetitive movements:  3) Step and reach forward step 4) Step and reach sideways step   5) Step and reach backwards step 6) Rock and reach forward/backward  7) Rock and reach sideways   Identified Functional Component Tasks include:  Sit to stand BIG functional component task with supervision 5 reps  2. BIG reciprocal arm swing 3. Extending right shoulder to reach for items behind him. 4. Stepping over an obstacle 5. Writing  Pt. worked on Improving the amplitude of bilateral arm swing holding bilateral 3# hand weight, alternating arm swing with the end point to the wall for feedback.   BIG ambulation:     1355 ft. with increased cues being required for right arm swing, and to increase amplitude of movements. Pt. worked on [employer], and [employer].   Pt. worked on balance with turning, and tapping cones placed in a circle with his foot. Pt. worked in a clockwise direction, followed by counterclockwise for the right, and left LE. Pt. then worked on tapping every other cone in each direction. Pt. worked on stepping over a linear set of cones placed close, and spread out. Pt. performed reps of stepping through a line of cones in a ladder pattern side stepping onto the cones Pt. required cues to increase the amplitude of  movements, and upright posture during each on these exercises.  Pt. worked on Sport and exercise psychologist on a 4" mat while reaching for, and moving [place] rings through the [employer]. Pt. Worked on throwing balls towards a target while standing on a 4" mat. Emphasis was placed on Amplitude of right arm swing while winding up to throw each ball underhand.      Pt. required fewer verbal cues, tactile cues, and cues for visual demonstration for form, technique, and amplitude of movements during each of the Sustained, and Multidirectional Maximal Daily Exercises. Pt. Continues to require cues when dual tasking with counting, and talking during the floor to ceiling MDE. Pt. Continues to require cues for counting when adding directional changes during the exercises. Hand/finger flicks for floor to ceiling MDE.  No LOB  noted during the Maximal daily exercises today.  Pt. Continues to require cues to increase the amplitude of bilateral arm swing during BIG walking, and upright posture. Although cues were required, Pt. Performed right arm swing posteriorly more consistently at larger amplitudes today. Pt. presents with LOB when stepping over cones placed closer together in a row.  Pt. performed alternating direction change to increase challenge for Side to side, forward step and reach, and sideways step and reach. Pt. Continues to present with less episodes of LOB during backward step and reach with the right. Pt. required max cues for proper technique, and increased amplitude of arm movements. Pt. Continues to benefit from OT services for LSVT to improve the amplitude of movements, improve posture, and promote safe gait, and overall function during ADLs, and IADL tasks.       PATIENT EDUCATION: Education details:  Sustained, and multi-directional  Maximal Daily exercises.  Person educated: Patient Education method: Customer service manager Education comprehension: verbalized understanding, returned demonstration, and  needs further education  HOME EXERCISE PROGRAM:     GOALS: Goals reviewed with patient? Yes  SHORT TERM GOALS: Target date: 10/19/2022  Pt. Will independently demonstrate the proper techniques for the Maximal Daily Exercise HEP. Baseline: Pt. Currently does not have Goal status: INITIAL  LONG TERM GOALS: Target date: 11/09/2022    Pt. Will increase the FOTO score by 2 points to reflect Pt. perceived improvement with assessment specific ADL, and IADLs. Baseline: FOTO: 92 Goal status: INITIAL  2.  Pt. Will initiate, and sustain bilateral reciprocal arm swing 100% of the time during the 6 min. Walk test to improve posture, and gait performance. Baseline: Eval: Pt. Presents with limited arm swing  during gait Goal status: INITIAL  3.  Pt. Will assume fully upright midline standing posture 100% of the time for all reps performed, and timed on the 5 times sit to stand test. Baseline: Eval: Forward trunk, head and neck posture, the 5th rep Pt. Did not assume full upright midline posture (half stand) Goal status: INITIAL  4.  Pt. Will improve writing skills in order to be able to independently, legibly, and efficiently fill out checks while under pressure, and multitasking.  Baseline: Eval: Pt. Is able to write his name with 75% legibility. Pt. Reports legibility changes, and tremors when writing out checks while under pressure.  Goal status: INITIAL   ASSESSMENT:  CLINICAL IMPRESSION:   Pt. required fewer verbal cues, tactile cues, and cues for visual demonstration for form, technique, and amplitude of movements during each of the Sustained, and Multidirectional Maximal Daily Exercises. Pt. Continues to require cues when dual tasking with counting, and talking during the floor to ceiling MDE. Pt. Continues to require cues for counting when adding directional changes during the exercises. Hand/finger flicks for floor to ceiling MDE.  No LOB  noted during the Maximal daily exercises  today.  Pt. continues to require cues to increase the amplitude of bilateral arm swing during BIG walking, and upright posture. Although cues were required,  Pt. performed right arm swing posteriorly more consistently at larger amplitudes today. Pt. presents with LOB when stepping over cones placed closer together in a row.  Pt. performed alternating direction change to increase challenge for Side to side, forward step and reach, and sideways step and reach. Pt. Continues to present with less episodes of LOB during backward step and reach with the right. Pt. required max cues for proper technique, and increased amplitude of arm movements. Pt. continues to benefit from OT services for LSVT to improve the amplitude of movements, improve posture, and promote safe gait, and overall function during ADLs, and IADL tasks.   PERFORMANCE DEFICITS: in functional skills including ADLs, IADLs, coordination, dexterity, ROM, strength, pain, Fine motor control, Gross motor control, body mechanics, endurance, decreased knowledge of use of DME, and UE functional use, cognitive skills, and psychosocial skills including coping strategies, environmental adaptation, habits, and routines and behaviors.   IMPAIRMENTS: are limiting patient from ADLs, IADLs, and leisure.   CO-MORBIDITIES; may have co-morbidities  that affects occupational performance. Patient will benefit from skilled OT to address above impairments and improve overall function.  MODIFICATION OR ASSISTANCE TO COMPLETE EVALUATION: Min-Moderate modification of tasks or assist with assess necessary to complete an evaluation.  OT OCCUPATIONAL PROFILE AND HISTORY: Detailed assessment: Review of records and additional review of physical, cognitive, psychosocial history related to current functional performance.  CLINICAL DECISION MAKING: Moderate - several treatment options, min-mod task modification necessary  REHAB POTENTIAL: Good  EVALUATION COMPLEXITY:  Moderate    PLAN:  OT FREQUENCY: 4x/week  OT DURATION: 6 weeks  PLANNED INTERVENTIONS: self care/ADL training, therapeutic  exercise, therapeutic activity, neuromuscular re-education, manual therapy, passive range of motion, and paraffin, balance training  RECOMMENDED OTHER SERVICES: N/A  CONSULTED AND AGREED WITH PLAN OF CARE: Patient  PLAN FOR NEXT SESSION:  Continues to work on form and technique with the Maximal Daily exercises.   Harrel Carina, MS, OTR/L   Harrel Carina, OT 10/13/2022, 8:52 AM

## 2022-10-15 ENCOUNTER — Ambulatory Visit: Payer: Medicare HMO | Admitting: Occupational Therapy

## 2022-10-15 DIAGNOSIS — G20B2 Parkinson's disease with dyskinesia, with fluctuations: Secondary | ICD-10-CM | POA: Diagnosis not present

## 2022-10-15 DIAGNOSIS — M6281 Muscle weakness (generalized): Secondary | ICD-10-CM | POA: Diagnosis not present

## 2022-10-15 DIAGNOSIS — R278 Other lack of coordination: Secondary | ICD-10-CM

## 2022-10-15 NOTE — Therapy (Addendum)
OUTPATIENT OCCUPATIONAL THERAPY NEURO TREATMENT  Patient Name: Robert Fuller MRN: [id number] DOB:1951/06/28, 71 y.o., male Today's Date: 10/15/2022  PCP: Dr. Miguel Aschoff, MD REFERRING PROVIDER: Dr. Jennings Books, MD    OT End of Session - 10/15/22 1732     Visit Number 9    Number of Visits 17    Date for OT Re-Evaluation 11/09/22    Authorization Time Period Progress reporting period starting 09/28/2022    OT Start Time 1605    OT Stop Time 1700    OT Time Calculation (min) 55 min    Activity Tolerance Patient tolerated treatment well    Behavior During Therapy [facility] for tasks assessed/performed                         Past Medical History:  Diagnosis Date   Barrett's esophagus    Cough    Esophageal reflux    Kidney stone    Past Surgical History:  Procedure Laterality Date   COLONOSCOPY WITH PROPOFOL N/A 07/21/2022   Procedure: COLONOSCOPY WITH PROPOFOL;  Surgeon: [name], MD;  Location: [facility] ENDOSCOPY;  Service: Endoscopy;  Laterality: N/A;   ESOPHAGOGASTRODUODENOSCOPY (EGD) WITH PROPOFOL N/A 04/06/2016   Procedure: ESOPHAGOGASTRODUODENOSCOPY (EGD) WITH PROPOFOL;  Surgeon: Hulen Luster, MD;  Location: [facility] ENDOSCOPY;  Service: Gastroenterology;  Laterality: N/A;   ESOPHAGOGASTRODUODENOSCOPY (EGD) WITH PROPOFOL N/A 07/21/2022   Procedure: ESOPHAGOGASTRODUODENOSCOPY (EGD) WITH PROPOFOL;  Surgeon: [name], MD;  Location: [facility] ENDOSCOPY;  Service: Endoscopy;  Laterality: N/A;   NO PAST SURGERIES     UPPER GI ENDOSCOPY  04/06/16   normal other than esophageal changes present consistent with barretts esophagus, repeat 2019   Patient Active Problem List   Diagnosis Date Noted   ED (erectile dysfunction) of organic origin 08/04/2016   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 Reports having a rezoning planning meeting today.  SUBJECTIVE STATEMENT:  Pt. reports that he just found out he has Parkinson's disease at his last MD visit a couple of weeks ago. Pt accompanied by: self  PERTINENT HISTORY:   Pt. is a 71 y.o. male who was recently diagnosed with Parkinson's Disease with bilateral hand tremors on 09/14/2022. Pt. has bilateral Carpal Tunnel Syndrome with numbness in digits of his bilateral hands.   PRECAUTIONS: None  WEIGHT BEARING RESTRICTIONS: No  PAIN:  Are you having pain? No  FALLS: Has patient fallen in last 6 months? No  LIVING ENVIRONMENT: Lives with: lives with their spouse Lives in: House/apartment Stairs: Yes: Internal: 3 floors steps; on right going up and on left going up 3 stories Has following equipment at home: Grab bars  PLOF: Independent  PATIENT GOALS: To improve amplitude of movement  OBJECTIVE:   HAND DOMINANCE: Right  ADLs: Overall ADLs:  Independent Transfers/ambulation related to ADLs: Eating: Independent Grooming:  Independent UB Dressing: Independent LB Dressing: Independent Toileting: Independent Bathing: Independent Tub Shower transfers:  Independent no grab bars Equipment: Grab bars  IADLs: Shopping: Independent Light housekeeping:  Independent Meal Prep:  Independent Community mobility: driving Medication management:  Independent Financial management: No change Handwriting: 75% legible: Pt. reports decreased  legibility when multitasking   MOBILITY STATUS: Independent  POSTURE COMMENTS:  Pectoral tightness with rounded shoulders Sitting balance:  I Unsupported static, and dynamic: good  ACTIVITY TOLERANCE: Activity tolerance: WFL  FUNCTIONAL OUTCOME MEASURES:  FOTO: 92 Timed Up, and Go  (TUG) Score: 8 sec. 5  Times sit to stand Score: 12 sec. (Pt. did not come into a full stand for the last rep. 2/2 foot placement) 6-Minute Walk Test Score: 1875 ft. Berg Balance Scale Score: 56  Freezing of Gait Questionnaire Score: 0  Bilateral  Hand Tremors with the left greater than right. Pt. Reports tremors are worse with dual or multitasking, also when under pressure.   UPPER EXTREMITY ROM:    Active ROM Right Eval WFL Left Eval [facility]  Shoulder flexion    Shoulder abduction    Shoulder adduction    Shoulder extension    Shoulder internal rotation    Shoulder external rotation    Elbow flexion    Elbow extension    Wrist flexion    Wrist extension    Wrist ulnar deviation    Wrist radial deviation    Wrist pronation    Wrist supination    (Blank rows = not tested)  UPPER EXTREMITY MMT:     MMT Right eval Left eval  Shoulder flexion 5/5 5/5  Shoulder abduction 5/5 5/5  Shoulder adduction    Shoulder extension    Shoulder internal rotation    Shoulder external rotation    Middle trapezius    Lower trapezius    Elbow flexion 5/5 5/5  Elbow extension 5/5 5/5  Wrist flexion    Wrist extension 5/5 5/5  Wrist ulnar deviation    Wrist radial deviation    Wrist pronation    Wrist supination    (Blank rows = not tested)  HAND FUNCTION: Grip strength: Right: 112 lbs; Left: 110 lbs, Lateral pinch: Right: 30 lbs, Left: 26 lbs, and 3 point pinch: Right: 23 lbs, Left: 23 lbs  COORDINATION: 9 Hole Peg test: Right: 26 sec; Left: 24 sec  with Left hand tremors noted   SENSATION: Numbness in the bilateral 2nd, 3rd, and 4th digits  EDEMA: None  COGNITION: Overall cognitive status: Within functional limits for tasks assessed  VISION:  WFL for tasks assessed  PERCEPTION: WFL  PRAXIS: WFL   TODAY'S TREATMENT:                                                                                                                              DATE:  10/15/2022  Pt. tolerated pectoral  stretches in standing at the wall for feedback. Pectoral stretches were performed to promote posture, and alignment.    LSVT:  Patient was seen for LSVT Daily Session Maximal Daily Exercises for facilitation, and coordination of movement.   Maximum Sustained Movements are designed to rescale the amplitude of movement output for generalization to daily functional activities.Pt. Performed as follows for 1 set  of 10 repetitions each multi-directional sustained movements:  1) Floor to ceiling  2) Side to side multidirectional   Multi-directional repetitive movements performed in standing and are designed to provide retraining effort needed for sustained muscle activation in tasks. Performed as follows for 1 set of 10 repetitions each of multi-directional repetitive movements:  3) Step and reach forward step 4) Step and reach sideways step   5) Step and reach backwards step 6) Rock and reach forward/backward  7) Rock and reach sideways   Identified Functional Component Tasks include:  Sit to stand BIG functional component task with supervision 5 reps  2. BIG reciprocal arm swing 3. Extending right shoulder to reach for items behind him. 4. Stepping over an obstacle 5. Writing   Pt. worked on Improving the amplitude of bilateral arm swing holding bilateral 3# hand weight, alternating arm swing with the end point to the wall as a target for feedback.   BIG ambulation:     1355 ft. with increased cues being required for right arm swing, and to increase amplitude of movements. Pt. worked on [employer], and [employer].  Pt. worked on balance skills stepping over a linear set of cones placed  approximately 12-14" apart. Pt. Worked on stepping over  cones, followed by reps of stepping onto cones while standing on one foot. Pt. Worked on reps of step ups with high knees onto an Aerobic step board with no risers. Pt. Then performed reps of straddling the board with alternating reps of high  knees on and off of the board without board risers. Pt. required cues to increase the amplitude of movements, and upright posture during each of these exercises.     Pt. continues to require fewer verbal cues, tactile cues, and cues for visual demonstration for form, technique, and amplitude of movements during each of the Sustained, and Multidirectional Maximal Daily Exercises. Pt. Continues to require cues when dual tasking with counting, and talking during the floor to ceiling MDE. Pt. Continues to require cues for counting when adding directional changes during the exercises. Pt. Performs Hand/finger flicks for floor to ceiling MDE.  No LOB  noted during the Maximal daily exercises today.  Pt. Continues to require cues to increase the amplitude of bilateral arm swing during BIG walking, and upright posture. Pt. Required the use of a target for cuing the proper height of the back arm during the sideways step and reach.  Pt. Continues to present with LOB when stepping over cones placed closer together in a row.  Pt. was able to perform the high knee steps onto the aerobic board, however had difficulty with coordinating straddling the board with alternating high knees. Pt. performed alternating direction change to increase challenge for Side to side, and forward step and reach. The remainder of the MDE were performed reps of one side at a time in order to focus on movement patterns, and arm position. Pt. Continues to present with less episodes of LOB during backward step and reach with the right. Pt. required max cues for proper technique, and increased amplitude of arm movements. Pt. Continues to benefit from OT services for LSVT to improve the amplitude of movements, improve posture, and promote safe gait, and overall function during ADLs, and IADL tasks.       PATIENT EDUCATION: Education details:  Sustained, and multi-directional  Maximal Daily exercises.  Person educated: Patient Education method:  Customer service manager Education comprehension: verbalized understanding, returned demonstration, and needs further education  HOME EXERCISE PROGRAM:     GOALS: Goals reviewed with patient? Yes  SHORT TERM GOALS: Target date: 10/19/2022    Pt. Will independently demonstrate the proper techniques for the Maximal Daily Exercise HEP. Baseline: Pt. Currently does not have Goal status: INITIAL  LONG TERM GOALS: Target date: 11/09/2022    Pt. Will increase the FOTO score by 2 points to reflect Pt. perceived improvement with assessment specific ADL, and IADLs. Baseline: FOTO: 92 Goal status: INITIAL  2.  Pt. Will initiate, and sustain bilateral reciprocal arm swing 100% of the time during the 6 min. Walk test to improve posture, and gait performance. Baseline: Eval: Pt. Presents with limited arm swing  during gait Goal status: INITIAL  3.  Pt. Will assume fully upright midline standing posture 100% of the time for all reps performed, and timed on the 5 times sit to stand test. Baseline: Eval: Forward trunk, head and neck posture, the 5th rep Pt. Did not assume full upright midline posture (half stand) Goal status: INITIAL  4.  Pt. Will improve writing skills in order to be able to independently, legibly, and efficiently fill out checks while under pressure, and multitasking.  Baseline: Eval: Pt. Is able to write his name with 75% legibility. Pt. Reports legibility changes, and tremors when writing out checks while under pressure.  Goal status: INITIAL   ASSESSMENT:  CLINICAL IMPRESSION:   Pt. continues to require fewer verbal cues, tactile cues, and cues for visual demonstration for form, technique, and amplitude of movements during each of the Sustained, and Multidirectional Maximal Daily Exercises. Pt. Continues to require cues when dual tasking with counting, and talking during the floor to ceiling MDE. Pt. Continues to require cues for counting when adding directional  changes during the exercises. Pt. Performs Hand/finger flicks for floor to ceiling MDE.  No LOB  noted during the Maximal daily exercises today.  Pt. Continues to require cues to increase the amplitude of bilateral arm swing during BIG walking, and upright posture. Pt. Required the use of a target for cuing the proper height of the back arm during the sideways step and reach.  Pt. Continues to present with LOB when stepping over cones placed closer together in a row.  Pt. was able to perform the high knee steps onto the aerobic board, however had difficulty with coordinating straddling the board with alternating high knees. Pt. performed alternating direction change to increase challenge for Side to side, and forward step and reach. The remainder of the MDE were performed reps of one side at a time in order to focus on movement patterns, and arm position. Pt. Continues to present with less episodes of LOB during backward step and reach with the right. Pt. required max cues for proper technique, and increased amplitude of arm movements. Pt. Continues to benefit from OT services for LSVT to improve the amplitude of movements, improve posture, and promote safe gait, and overall function during ADLs, and IADL tasks.   Marland Kitchen    PERFORMANCE DEFICITS: in functional skills including ADLs, IADLs, coordination, dexterity, ROM, strength, pain, Fine motor control, Gross motor control, body mechanics, endurance, decreased knowledge of use of DME, and UE functional use, cognitive skills, and psychosocial skills including coping strategies, environmental adaptation, habits, and routines and behaviors.   IMPAIRMENTS: are limiting patient from ADLs, IADLs, and leisure.   CO-MORBIDITIES; may have co-morbidities  that affects occupational performance. Patient will benefit from skilled OT to address above impairments and improve overall function.  MODIFICATION  OR ASSISTANCE TO COMPLETE EVALUATION: Min-Moderate modification of  tasks or assist with assess necessary to complete an evaluation.  OT OCCUPATIONAL PROFILE AND HISTORY: Detailed assessment: Review of records and additional review of physical, cognitive, psychosocial history related to current functional performance.  CLINICAL DECISION MAKING: Moderate - several treatment options, min-mod task modification necessary  REHAB POTENTIAL: Good  EVALUATION COMPLEXITY: Moderate    PLAN:  OT FREQUENCY: 4x/week  OT DURATION: 6 weeks  PLANNED INTERVENTIONS: self care/ADL training, therapeutic exercise, therapeutic activity, neuromuscular re-education, manual therapy, passive range of motion, and paraffin, balance training  RECOMMENDED OTHER SERVICES: N/A  CONSULTED AND AGREED WITH PLAN OF CARE: Patient  PLAN FOR NEXT SESSION:  Continues to work on form and technique with the Maximal Daily exercises.   Harrel Carina, MS, OTR/L   Harrel Carina, OT 10/15/2022, 6:01 PM

## 2022-10-19 ENCOUNTER — Ambulatory Visit: Payer: Medicare HMO

## 2022-10-19 DIAGNOSIS — G20B2 Parkinson's disease with dyskinesia, with fluctuations: Secondary | ICD-10-CM | POA: Diagnosis not present

## 2022-10-19 DIAGNOSIS — R278 Other lack of coordination: Secondary | ICD-10-CM | POA: Diagnosis not present

## 2022-10-19 DIAGNOSIS — M6281 Muscle weakness (generalized): Secondary | ICD-10-CM | POA: Diagnosis not present

## 2022-10-19 NOTE — Therapy (Unsigned)
OUTPATIENT OCCUPATIONAL THERAPY NEURO TREATMENT  Patient Name: Robert Fuller MRN: [id number] DOB:1951/05/04, 71 y.o., male Today's Date: 10/15/2022  PCP: Dr. Miguel Aschoff, MD REFERRING PROVIDER: Dr. Jennings Books, MD    OT End of Session - 10/15/22 1732     Visit Number 9    Number of Visits 17    Date for OT Re-Evaluation 11/09/22    Authorization Time Period Progress reporting period starting 09/28/2022    OT Start Time 1605    OT Stop Time 1700    OT Time Calculation (min) 55 min    Activity Tolerance Patient tolerated treatment well    Behavior During Therapy [facility] for tasks assessed/performed                         Past Medical History:  Diagnosis Date   Barrett's esophagus    Cough    Esophageal reflux    Kidney stone    Past Surgical History:  Procedure Laterality Date   COLONOSCOPY WITH PROPOFOL N/A 07/21/2022   Procedure: COLONOSCOPY WITH PROPOFOL;  Surgeon: [name], MD;  Location: [facility] ENDOSCOPY;  Service: Endoscopy;  Laterality: N/A;   ESOPHAGOGASTRODUODENOSCOPY (EGD) WITH PROPOFOL N/A 04/06/2016   Procedure: ESOPHAGOGASTRODUODENOSCOPY (EGD) WITH PROPOFOL;  Surgeon: Hulen Luster, MD;  Location: [facility] ENDOSCOPY;  Service: Gastroenterology;  Laterality: N/A;   ESOPHAGOGASTRODUODENOSCOPY (EGD) WITH PROPOFOL N/A 07/21/2022   Procedure: ESOPHAGOGASTRODUODENOSCOPY (EGD) WITH PROPOFOL;  Surgeon: [name], MD;  Location: [facility] ENDOSCOPY;  Service: Endoscopy;  Laterality: N/A;   NO PAST SURGERIES     UPPER GI ENDOSCOPY  04/06/16   normal other than esophageal changes present consistent with barretts esophagus, repeat 2019   Patient Active Problem List   Diagnosis Date Noted   ED (erectile dysfunction) of organic origin 08/04/2016   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 Reports having a rezoning planning meeting today.  SUBJECTIVE STATEMENT:  Pt. reports that he just found out he has Parkinson's disease at his last MD visit a couple of weeks ago. Pt accompanied by: self  PERTINENT HISTORY:   Pt. is a 71 y.o. male who was recently diagnosed with Parkinson's Disease with bilateral hand tremors on 09/14/2022. Pt. has bilateral Carpal Tunnel Syndrome with numbness in digits of his bilateral hands.   PRECAUTIONS: None  WEIGHT BEARING RESTRICTIONS: No  PAIN:  Are you having pain? No  FALLS: Has patient fallen in last 6 months? No  LIVING ENVIRONMENT: Lives with: lives with their spouse Lives in: House/apartment Stairs: Yes: Internal: 3 floors steps; on right going up and on left going up 3 stories Has following equipment at home: Grab bars  PLOF: Independent  PATIENT GOALS: To improve amplitude of movement  OBJECTIVE:   HAND DOMINANCE: Right  ADLs: Overall ADLs:  Independent Transfers/ambulation related to ADLs: Eating: Independent Grooming:  Independent UB Dressing: Independent LB Dressing: Independent Toileting: Independent Bathing: Independent Tub Shower transfers:  Independent no grab bars Equipment: Grab bars  IADLs: Shopping: Independent Light housekeeping:  Independent Meal Prep:  Independent Community mobility: driving Medication management:  Independent Financial management: No change Handwriting: 75% legible: Pt. reports decreased  legibility when multitasking   MOBILITY STATUS: Independent  POSTURE COMMENTS:  Pectoral tightness with rounded shoulders Sitting balance:  I Unsupported static, and dynamic: good  ACTIVITY TOLERANCE: Activity tolerance: WFL  FUNCTIONAL OUTCOME MEASURES:  FOTO: 92 Timed Up, and Go  (TUG) Score: 8 sec. 5  Times sit to stand Score: 12 sec. (Pt. did not come into a full stand for the last rep. 2/2 foot placement); 10/19/22: 13 sec 6-Minute Walk Test Score: 1875 ft. Berg Balance Scale Score: 56  Freezing of Gait Questionnaire Score: 0  Bilateral  Hand Tremors with the left greater than right. Pt. Reports tremors are worse with dual or multitasking, also when under pressure.   UPPER EXTREMITY ROM:    Active ROM Right Eval WFL Left Eval [facility]  Shoulder flexion    Shoulder abduction    Shoulder adduction    Shoulder extension    Shoulder internal rotation    Shoulder external rotation    Elbow flexion    Elbow extension    Wrist flexion    Wrist extension    Wrist ulnar deviation    Wrist radial deviation    Wrist pronation    Wrist supination    (Blank rows = not tested)  UPPER EXTREMITY MMT:     MMT Right eval Left eval  Shoulder flexion 5/5 5/5  Shoulder abduction 5/5 5/5  Shoulder adduction    Shoulder extension    Shoulder internal rotation    Shoulder external rotation    Middle trapezius    Lower trapezius    Elbow flexion 5/5 5/5  Elbow extension 5/5 5/5  Wrist flexion    Wrist extension 5/5 5/5  Wrist ulnar deviation    Wrist radial deviation    Wrist pronation    Wrist supination    (Blank rows = not tested)  HAND FUNCTION: Grip strength: Right: 112 lbs; Left: 110 lbs, Lateral pinch: Right: 30 lbs, Left: 26 lbs, and 3 point pinch: Right: 23 lbs, Left: 23 lbs R grip 94, L 97 (mowing lawn and blowing) , R lateral 28; L 27, 3 point pinch, 19, L 22  COORDINATION: 9 Hole Peg test: Right: 26 sec; Left: 24 sec  with Left hand tremors noted  R : 29 sec; L 33   SENSATION: Numbness in the bilateral 2nd, 3rd, and 4th digits No longer going numb at night anymore when wearing the splints.  EDEMA: None  COGNITION: Overall cognitive status: Within functional limits for tasks assessed  VISION:  WFL for tasks assessed  PERCEPTION: WFL  PRAXIS:  WFL   TODAY'S TREATMENT:                                                                                                                              DATE:  10/15/2022  Pt. tolerated pectoral stretches in standing at the wall for feedback. Pectoral stretches were performed to promote posture, and alignment.    LSVT:  Patient was seen for LSVT Daily Session Maximal Daily Exercises for facilitation, and coordination of movement.   Maximum Sustained Movements are designed to rescale the amplitude of movement output for generalization to daily functional activities.Pt. Performed as follows for 1 set of 10 repetitions each multi-directional sustained movements:  1) Floor to ceiling  2) Side to side multidirectional   Multi-directional repetitive movements performed in standing and are designed to provide retraining effort needed for sustained muscle activation in tasks. Performed as follows for 1 set of 10 repetitions each of multi-directional repetitive movements:  3) Step and reach forward step 4) Step and reach sideways step   5) Step and reach backwards step 6) Rock and reach forward/backward  7) Rock and reach sideways   Identified Functional Component Tasks include:  Sit to stand BIG functional component task with supervision 5 reps  2. BIG reciprocal arm swing 3. Extending right shoulder to reach for items behind him. 4. Stepping over an obstacle 5. Writing   Pt. worked on Improving the amplitude of bilateral arm swing holding bilateral 3# hand weight, alternating arm swing with the end point to the wall as a target for feedback.   BIG ambulation:     1355 ft. with increased cues being required for right arm swing, and to increase amplitude of movements. Pt. worked on [employer], and [employer].  Pt. worked on balance skills stepping over a linear set of cones placed  approximately 12-14" apart. Pt. Worked on stepping over  cones, followed by reps of stepping onto cones  while standing on one foot. Pt. Worked on reps of step ups with high knees onto an Aerobic step board with no risers. Pt. Then performed reps of straddling the board with alternating reps of high knees on and off of the board without board risers. Pt. required cues to increase the amplitude of movements, and upright posture during each of these exercises.     Pt. continues to require fewer verbal cues, tactile cues, and cues for visual demonstration for form, technique, and amplitude of movements during each of the Sustained, and Multidirectional Maximal Daily Exercises. Pt. Continues to require cues when dual tasking with counting, and talking during the floor to ceiling MDE. Pt. Continues to require cues for counting when adding directional changes during the exercises. Pt. Performs Hand/finger flicks for floor to ceiling MDE.  No LOB  noted during the Maximal daily exercises today.  Pt. Continues to require cues to increase the amplitude of bilateral arm swing during BIG walking, and upright posture. Pt. Required the use of a target for cuing the proper height of the back arm during the sideways step and reach.  Pt. Continues to present with LOB when stepping over cones placed closer together in a row.  Pt. was able to perform the high knee steps onto the aerobic board, however had difficulty with coordinating straddling the board with alternating high knees. Pt. performed alternating direction change to increase challenge for Side to side, and forward step and reach. The remainder of the MDE were performed reps of one side at a time in order to focus on movement patterns, and arm position. Pt. Continues to present with less episodes of LOB during backward step and reach with the right. Pt. required max cues for proper technique, and increased amplitude of arm movements. Pt. Continues to benefit from OT services for LSVT to improve the amplitude of movements, improve posture, and promote safe gait, and overall  function during  ADLs, and IADL tasks.       PATIENT EDUCATION: Education details:  Sustained, and multi-directional  Maximal Daily exercises.  Person educated: Patient Education method: Customer service manager Education comprehension: verbalized understanding, returned demonstration, and needs further education  HOME EXERCISE PROGRAM:     GOALS: Goals reviewed with patient? Yes  SHORT TERM GOALS: Target date: 10/19/2022    Pt. Will independently demonstrate the proper techniques for the Maximal Daily Exercise HEP. Baseline: Pt. Currently does not have Goal status: INITIAL  LONG TERM GOALS: Target date: 11/09/2022    Pt. Will increase the FOTO score by 2 points to reflect Pt. perceived improvement with assessment specific ADL, and IADLs. Baseline: FOTO: 92 Goal status: INITIAL  2.  Pt. Will initiate, and sustain bilateral reciprocal arm swing 100% of the time during the 6 min. Walk test to improve posture, and gait performance. Baseline: Eval: Pt. Presents with limited arm swing  during gait Goal status: INITIAL  3.  Pt. Will assume fully upright midline standing posture 100% of the time for all reps performed, and timed on the 5 times sit to stand test. Baseline: Eval: Forward trunk, head and neck posture, the 5th rep Pt. Did not assume full upright midline posture (half stand) Goal status: INITIAL  4.  Pt. Will improve writing skills in order to be able to independently, legibly, and efficiently fill out checks while under pressure, and multitasking.  Baseline: Eval: Pt. Is able to write his name with 75% legibility. Pt. Reports legibility changes, and tremors when writing out checks while under pressure.  Goal status: INITIAL   ASSESSMENT:  CLINICAL IMPRESSION:   Pt. continues to require fewer verbal cues, tactile cues, and cues for visual demonstration for form, technique, and amplitude of movements during each of the Sustained, and Multidirectional Maximal  Daily Exercises. Pt. Continues to require cues when dual tasking with counting, and talking during the floor to ceiling MDE. Pt. Continues to require cues for counting when adding directional changes during the exercises. Pt. Performs Hand/finger flicks for floor to ceiling MDE.  No LOB  noted during the Maximal daily exercises today.  Pt. Continues to require cues to increase the amplitude of bilateral arm swing during BIG walking, and upright posture. Pt. Required the use of a target for cuing the proper height of the back arm during the sideways step and reach.  Pt. Continues to present with LOB when stepping over cones placed closer together in a row.  Pt. was able to perform the high knee steps onto the aerobic board, however had difficulty with coordinating straddling the board with alternating high knees. Pt. performed alternating direction change to increase challenge for Side to side, and forward step and reach. The remainder of the MDE were performed reps of one side at a time in order to focus on movement patterns, and arm position. Pt. Continues to present with less episodes of LOB during backward step and reach with the right. Pt. required max cues for proper technique, and increased amplitude of arm movements. Pt. Continues to benefit from OT services for LSVT to improve the amplitude of movements, improve posture, and promote safe gait, and overall function during ADLs, and IADL tasks.   Marland Kitchen    PERFORMANCE DEFICITS: in functional skills including ADLs, IADLs, coordination, dexterity, ROM, strength, pain, Fine motor control, Gross motor control, body mechanics, endurance, decreased knowledge of use of DME, and UE functional use, cognitive skills, and psychosocial skills including coping strategies, environmental adaptation, habits,  and routines and behaviors.   IMPAIRMENTS: are limiting patient from ADLs, IADLs, and leisure.   CO-MORBIDITIES; may have co-morbidities  that affects occupational  performance. Patient will benefit from skilled OT to address above impairments and improve overall function.  MODIFICATION OR ASSISTANCE TO COMPLETE EVALUATION: Min-Moderate modification of tasks or assist with assess necessary to complete an evaluation.  OT OCCUPATIONAL PROFILE AND HISTORY: Detailed assessment: Review of records and additional review of physical, cognitive, psychosocial history related to current functional performance.  CLINICAL DECISION MAKING: Moderate - several treatment options, min-mod task modification necessary  REHAB POTENTIAL: Good  EVALUATION COMPLEXITY: Moderate    PLAN:  OT FREQUENCY: 4x/week  OT DURATION: 6 weeks  PLANNED INTERVENTIONS: self care/ADL training, therapeutic exercise, therapeutic activity, neuromuscular re-education, manual therapy, passive range of motion, and paraffin, balance training  RECOMMENDED OTHER SERVICES: N/A  CONSULTED AND AGREED WITH PLAN OF CARE: Patient  PLAN FOR NEXT SESSION:  Continues to work on form and technique with the Maximal Daily exercises.   Harrel Carina, MS, OTR/L   Harrel Carina, OT 10/15/2022, 6:01 PM

## 2022-10-20 ENCOUNTER — Ambulatory Visit: Payer: Medicare HMO | Admitting: Occupational Therapy

## 2022-10-20 DIAGNOSIS — R278 Other lack of coordination: Secondary | ICD-10-CM | POA: Diagnosis not present

## 2022-10-20 DIAGNOSIS — G20B2 Parkinson's disease with dyskinesia, with fluctuations: Secondary | ICD-10-CM | POA: Diagnosis not present

## 2022-10-20 DIAGNOSIS — M6281 Muscle weakness (generalized): Secondary | ICD-10-CM | POA: Diagnosis not present

## 2022-10-20 NOTE — Therapy (Signed)
OUTPATIENT OCCUPATIONAL THERAPY NEURO TREATMENT  Patient Name: Robert Fuller MRN: [id number] DOB:03-05-1951, 71 y.o., male Today's Date: 10/20/2022  PCP: Dr. Miguel Aschoff, MD REFERRING PROVIDER: Dr. Jennings Books, MD    OT End of Session - 10/20/22 1738     Visit Number 11    Number of Visits 17    Date for OT Re-Evaluation 11/09/22    Authorization Time Period Progress reporting period starting 09/28/2022    OT Start Time 1520    OT Stop Time 1615    OT Time Calculation (min) 55 min    Activity Tolerance Patient tolerated treatment well    Behavior During Therapy [facility] for tasks assessed/performed                         Past Medical History:  Diagnosis Date   Barrett's esophagus    Cough    Esophageal reflux    Kidney stone    Past Surgical History:  Procedure Laterality Date   COLONOSCOPY WITH PROPOFOL N/A 07/21/2022   Procedure: COLONOSCOPY WITH PROPOFOL;  Surgeon: [name], MD;  Location: [facility] ENDOSCOPY;  Service: Endoscopy;  Laterality: N/A;   ESOPHAGOGASTRODUODENOSCOPY (EGD) WITH PROPOFOL N/A 04/06/2016   Procedure: ESOPHAGOGASTRODUODENOSCOPY (EGD) WITH PROPOFOL;  Surgeon: Hulen Luster, MD;  Location: [facility] ENDOSCOPY;  Service: Gastroenterology;  Laterality: N/A;   ESOPHAGOGASTRODUODENOSCOPY (EGD) WITH PROPOFOL N/A 07/21/2022   Procedure: ESOPHAGOGASTRODUODENOSCOPY (EGD) WITH PROPOFOL;  Surgeon: [name], MD;  Location: [facility] ENDOSCOPY;  Service: Endoscopy;  Laterality: N/A;   NO PAST SURGERIES     UPPER GI ENDOSCOPY  04/06/16   normal other than esophageal changes present consistent with barretts esophagus, repeat 2019   Patient Active Problem List   Diagnosis Date Noted   ED (erectile dysfunction) of organic origin 08/04/2016   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 Reports having a rezoning planning meeting today.  SUBJECTIVE STATEMENT:  Pt. reports that he just found out he has Parkinson's disease at his last MD visit a couple of weeks ago. Pt accompanied by: self  PERTINENT HISTORY:   Pt. is a 71 y.o. male who was recently diagnosed with Parkinson's Disease with bilateral hand tremors on 09/14/2022. Pt. has bilateral Carpal Tunnel Syndrome with numbness in digits of his bilateral hands.   PRECAUTIONS: None  WEIGHT BEARING RESTRICTIONS: No  PAIN:  Are you having pain? No  FALLS: Has patient fallen in last 6 months? No  LIVING ENVIRONMENT: Lives with: lives with their spouse Lives in: House/apartment Stairs: Yes: Internal: 3 floors steps; on right going up and on left going up 3 stories Has following equipment at home: Grab bars  PLOF: Independent  PATIENT GOALS: To improve amplitude of movement  OBJECTIVE:   HAND DOMINANCE: Right  ADLs: Overall ADLs:  Independent Transfers/ambulation related to ADLs: Eating: Independent Grooming:  Independent UB Dressing: Independent LB Dressing: Independent Toileting: Independent Bathing: Independent Tub Shower transfers:  Independent no grab bars Equipment: Grab bars  IADLs: Shopping: Independent Light housekeeping:  Independent Meal Prep:  Independent Community mobility: driving Medication management:  Independent Financial management: No change Handwriting: 75% legible: Pt. reports decreased  legibility when multitasking   MOBILITY STATUS: Independent  POSTURE COMMENTS:  Pectoral tightness with rounded shoulders Sitting balance:  I Unsupported static, and dynamic: good  ACTIVITY TOLERANCE: Activity tolerance: WFL  FUNCTIONAL OUTCOME MEASURES:  FOTO: 92 Timed Up, and Go  (TUG) Score: 8 sec. 5  Times sit to stand Score: 12 sec. (Pt. did not come into a full stand for the last rep. 2/2 foot placement) 6-Minute Walk Test Score: 1875 ft. Berg Balance Scale Score: 56  Freezing of Gait Questionnaire Score: 0  Bilateral  Hand Tremors with the left greater than right. Pt. Reports tremors are worse with dual or multitasking, also when under pressure.   UPPER EXTREMITY ROM:    Active ROM Right Eval WFL Left Eval [facility]  Shoulder flexion    Shoulder abduction    Shoulder adduction    Shoulder extension    Shoulder internal rotation    Shoulder external rotation    Elbow flexion    Elbow extension    Wrist flexion    Wrist extension    Wrist ulnar deviation    Wrist radial deviation    Wrist pronation    Wrist supination    (Blank rows = not tested)  UPPER EXTREMITY MMT:     MMT Right eval Left eval  Shoulder flexion 5/5 5/5  Shoulder abduction 5/5 5/5  Shoulder adduction    Shoulder extension    Shoulder internal rotation    Shoulder external rotation    Middle trapezius    Lower trapezius    Elbow flexion 5/5 5/5  Elbow extension 5/5 5/5  Wrist flexion    Wrist extension 5/5 5/5  Wrist ulnar deviation    Wrist radial deviation    Wrist pronation    Wrist supination    (Blank rows = not tested)  HAND FUNCTION: Grip strength: Right: 112 lbs; Left: 110 lbs, Lateral pinch: Right: 30 lbs, Left: 26 lbs, and 3 point pinch: Right: 23 lbs, Left: 23 lbs  COORDINATION: 9 Hole Peg test: Right: 26 sec; Left: 24 sec  with Left hand tremors noted   SENSATION: Numbness in the bilateral 2nd, 3rd, and 4th digits  EDEMA: None  COGNITION: Overall cognitive status: Within functional limits for tasks assessed  VISION:  WFL for tasks assessed  PERCEPTION: WFL  PRAXIS: WFL   TODAY'S TREATMENT:                                                                                                                              DATE:  10/15/2022  Pt. tolerated pectoral  stretches in standing at the wall for feedback. Pectoral stretches were performed to promote posture, and alignment.    LSVT:  Patient was seen for LSVT Daily Session Maximal Daily Exercises for facilitation, and coordination of movement.   Maximum Sustained Movements are designed to rescale the amplitude of movement output for generalization to daily functional activities.Pt. Performed as follows for 1 set  of 10 repetitions each multi-directional sustained movements:  1) Floor to ceiling  2) Side to side multidirectional   Multi-directional repetitive movements performed in standing and are designed to provide retraining effort needed for sustained muscle activation in tasks. Performed as follows for 1 set of 10 repetitions each of multi-directional repetitive movements:  3) Step and reach forward step 4) Step and reach sideways step   5) Step and reach backwards step 6) Rock and reach forward/backward  7) Rock and reach sideways   Identified Functional Component Tasks include:  Sit to stand BIG functional component task with supervision 5 reps  2. BIG reciprocal arm swing 3. Extending right shoulder to reach for items behind him. 4. Stepping over an obstacle 5. Writing   Pt. worked on Improving the amplitude of bilateral arm swing holding bilateral 3# hand weight, alternating arm swing with the end point to the wall as a target for feedback.   BIG ambulation:     1355 ft. with increased cues being required for right arm swing, and to increase amplitude of movements. Pt. worked on [employer], and [employer]. Emphasis was placed on right arm swing when increasing the challenge while dual tasking with conversation  Pt. worked on balance skills stepping over a linear set of cones placed  approximately 12-14" apart. Pt. Worked on stepping over  cones, followed by reps of stepping onto cones while standing on one foot. Pt. Worked on  Pt. Worked lateral steps through, and onto cones  with alternating high knees. Pt. Worked on modulating controlled movements tapping softly/slowly the cones after high knees. Pt. Worked on weaving through the cones in "S", and ladder fashion with cues for stepping to turn.    Pt. required cues for upright posture during the the task, increase the amplitude of movements, and upright posture during each of these exercises.     Pt. continues to require fewer verbal cues, tactile cues, and cues for visual demonstration for form, technique, and amplitude of movements during each of the Sustained, and Multidirectional Maximal Daily Exercises. Pt. Continues to require cues when dual tasking with counting, and talking during the floor to ceiling MDE. Pt. Continues to require cues for counting when adding directional changes during the exercises. Pt. Performs Hand/finger flicks for floor to ceiling MDE.  No LOB  noted during the Maximal daily exercises today.  Pt. Continues to require cues to increase the amplitude of bilateral arm swing during BIG walking, and upright posture. Pt. Required the use of a target for cuing the proper height of the back arm during the sideways step and reach.  Pt. Continues to present with LOB when stepping over cones, alternating high knees with stepping, and when modulating controlled movements with soft/slow tapping on cones.  Pt. performed alternating direction change to increase challenge for Side to side, and forward step and reach. The remainder of the MDE were performed reps of one side at a time in order to focus on movement patterns, and arm position. Pt. Continues to present with less episodes of LOB during backward step and reach with the right. Pt. required max cues for proper technique, and increased amplitude of arm movements. Pt. Continues to benefit from OT services for LSVT to improve the amplitude of movements, improve posture, and promote safe gait, and overall function during ADLs, and IADL tasks.       PATIENT  EDUCATION: Education details:  Sustained, and multi-directional  Maximal Daily exercises.  Person educated: Patient Education  method: Explanation and Demonstration Education comprehension: verbalized understanding, returned demonstration, and needs further education  HOME EXERCISE PROGRAM:  GOALS: Goals reviewed with patient? Yes   SHORT TERM GOALS: Target date: 10/19/2022     Pt. Will independently demonstrate the proper techniques for the Maximal Daily Exercise HEP. Baseline: Pt. Currently does not have; min vc for form, technique, and dual tasking with counting  Goal status: ongoing     LONG TERM GOALS: Target date: 11/09/2022     Pt. Will increase the FOTO score by 2 points to reflect Pt. perceived improvement with assessment specific ADL, and IADLs. Baseline: FOTO: 92; 10/19/22: 96.7 Goal status:    2.  Pt. Will initiate, and sustain bilateral reciprocal arm swing 100% of the time during the 6 min. Walk test to improve posture, and gait performance. Baseline: Eval: Pt. Presents with limited arm swing  during gait; 10/19/22: Pt requires min-mod vc to sustain reciprocal arm swing and erect posture  Goal status: ongoing    3.  Pt. Will assume fully upright midline standing posture 100% of the time for all reps performed, and timed on the 5 times sit to stand test. Baseline: Eval: Forward trunk, head and neck posture, the 5th rep Pt. Did not assume full upright midline posture (half stand); 10/19/22: pt performs with initial min vc for technique Goal status: ongoing   4.  Pt. Will improve writing skills in order to be able to independently, legibly, and efficiently fill out checks while under pressure, and multitasking.  Baseline: Eval: Pt. Is able to write his name with 75% legibility. Pt. Reports legibility changes, and tremors when writing out checks while under pressure; 10/19/22: pt reports legibility declines when fatigued, but using finger flicks and taking a break is  helpful.  Goal status: ongoing ASSESSMENT:  CLINICAL IMPRESSION:   Pt. continues to require fewer verbal cues, tactile cues, and cues for visual demonstration for form, technique, and amplitude of movements during each of the Sustained, and Multidirectional Maximal Daily Exercises. Pt. Continues to require cues when dual tasking with counting, and talking during the floor to ceiling MDE. Pt. Continues to require cues for counting when adding directional changes during the exercises. Pt. Performs Hand/finger flicks for floor to ceiling MDE.  No LOB  noted during the Maximal daily exercises today.  Pt. Continues to require cues to increase the amplitude of bilateral arm swing during BIG walking, and upright posture. Pt. Required the use of a target for cuing the proper height of the back arm during the sideways step and reach.  Pt. Continues to present with LOB when stepping over cones, alternating high knees with stepping, and when modulating controlled movements with soft/slow tapping on cones.  Pt. performed alternating direction change to increase challenge for Side to side, and forward step and reach. The remainder of the MDE were performed reps of one side at a time in order to focus on movement patterns, and arm position. Pt. Continues to present with less episodes of LOB during backward step and reach with the right. Pt. required max cues for proper technique, and increased amplitude of arm movements. Pt. Continues to benefit from OT services for LSVT to improve the amplitude of movements, improve posture, and promote safe gait, and overall function during ADLs, and IADL tasks.    Marland Kitchen    PERFORMANCE DEFICITS: in functional skills including ADLs, IADLs, coordination, dexterity, ROM, strength, pain, Fine motor control, Gross motor control, body mechanics, endurance, decreased knowledge of use of  DME, and UE functional use, cognitive skills, and psychosocial skills including coping strategies,  environmental adaptation, habits, and routines and behaviors.   IMPAIRMENTS: are limiting patient from ADLs, IADLs, and leisure.   CO-MORBIDITIES; may have co-morbidities  that affects occupational performance. Patient will benefit from skilled OT to address above impairments and improve overall function.  MODIFICATION OR ASSISTANCE TO COMPLETE EVALUATION: Min-Moderate modification of tasks or assist with assess necessary to complete an evaluation.  OT OCCUPATIONAL PROFILE AND HISTORY: Detailed assessment: Review of records and additional review of physical, cognitive, psychosocial history related to current functional performance.  CLINICAL DECISION MAKING: Moderate - several treatment options, min-mod task modification necessary  REHAB POTENTIAL: Good  EVALUATION COMPLEXITY: Moderate    PLAN:  OT FREQUENCY: 4x/week  OT DURATION: 6 weeks  PLANNED INTERVENTIONS: self care/ADL training, therapeutic exercise, therapeutic activity, neuromuscular re-education, manual therapy, passive range of motion, and paraffin, balance training  RECOMMENDED OTHER SERVICES: N/A  CONSULTED AND AGREED WITH PLAN OF CARE: Patient  PLAN FOR NEXT SESSION:  Continues to work on form and technique with the Maximal Daily exercises.   Harrel Carina, MS, OTR/L   Harrel Carina, OT 10/20/2022, 5:43 PM

## 2022-10-21 ENCOUNTER — Ambulatory Visit: Payer: Medicare HMO | Admitting: Occupational Therapy

## 2022-10-21 ENCOUNTER — Encounter: Payer: Self-pay | Admitting: Occupational Therapy

## 2022-10-21 DIAGNOSIS — M6281 Muscle weakness (generalized): Secondary | ICD-10-CM | POA: Diagnosis not present

## 2022-10-21 DIAGNOSIS — G20B2 Parkinson's disease with dyskinesia, with fluctuations: Secondary | ICD-10-CM | POA: Diagnosis not present

## 2022-10-21 DIAGNOSIS — R278 Other lack of coordination: Secondary | ICD-10-CM | POA: Diagnosis not present

## 2022-10-21 NOTE — Therapy (Signed)
OUTPATIENT OCCUPATIONAL THERAPY NEURO TREATMENT  Patient Name: Robert Fuller MRN: [id number] DOB:1951/07/07, 71 y.o., male Today's Date: 10/21/2022  PCP: Dr. Miguel Aschoff, MD REFERRING PROVIDER: Dr. Jennings Books, MD    OT End of Session - 10/21/22 1442     Visit Number 12    Number of Visits 17    Date for OT Re-Evaluation 11/09/22    Authorization Time Period Progress reporting period starting 09/28/2022    OT Start Time 1330    OT Stop Time 1427    OT Time Calculation (min) 57 min    Activity Tolerance Patient tolerated treatment well    Behavior During Therapy [facility] for tasks assessed/performed                         Past Medical History:  Diagnosis Date   Barrett's esophagus    Cough    Esophageal reflux    Kidney stone    Past Surgical History:  Procedure Laterality Date   COLONOSCOPY WITH PROPOFOL N/A 07/21/2022   Procedure: COLONOSCOPY WITH PROPOFOL;  Surgeon: [name], MD;  Location: [facility] ENDOSCOPY;  Service: Endoscopy;  Laterality: N/A;   ESOPHAGOGASTRODUODENOSCOPY (EGD) WITH PROPOFOL N/A 04/06/2016   Procedure: ESOPHAGOGASTRODUODENOSCOPY (EGD) WITH PROPOFOL;  Surgeon: Hulen Luster, MD;  Location: [facility] ENDOSCOPY;  Service: Gastroenterology;  Laterality: N/A;   ESOPHAGOGASTRODUODENOSCOPY (EGD) WITH PROPOFOL N/A 07/21/2022   Procedure: ESOPHAGOGASTRODUODENOSCOPY (EGD) WITH PROPOFOL;  Surgeon: [name], MD;  Location: [facility] ENDOSCOPY;  Service: Endoscopy;  Laterality: N/A;   NO PAST SURGERIES     UPPER GI ENDOSCOPY  04/06/16   normal other than esophageal changes present consistent with barretts esophagus, repeat 2019   Patient Active Problem List   Diagnosis Date Noted   ED (erectile dysfunction) of organic origin 08/04/2016   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 Reports having a rezoning planning meeting today.  SUBJECTIVE STATEMENT:  Pt. reports that he just found out he has Parkinson's disease at his last MD visit a couple of weeks ago. Pt accompanied by: self  PERTINENT HISTORY:   Pt. is a 71 y.o. male who was recently diagnosed with Parkinson's Disease with bilateral hand tremors on 09/14/2022. Pt. has bilateral Carpal Tunnel Syndrome with numbness in digits of his bilateral hands.   PRECAUTIONS: None  WEIGHT BEARING RESTRICTIONS: No  PAIN:  Are you having pain? No  FALLS: Has patient fallen in last 6 months? No  LIVING ENVIRONMENT: Lives with: lives with their spouse Lives in: House/apartment Stairs: Yes: Internal: 3 floors steps; on right going up and on left going up 3 stories Has following equipment at home: Grab bars  PLOF: Independent  PATIENT GOALS: To improve amplitude of movement  OBJECTIVE:   HAND DOMINANCE: Right  ADLs: Overall ADLs:  Independent Transfers/ambulation related to ADLs: Eating: Independent Grooming:  Independent UB Dressing: Independent LB Dressing: Independent Toileting: Independent Bathing: Independent Tub Shower transfers:  Independent no grab bars Equipment: Grab bars  IADLs: Shopping: Independent Light housekeeping:  Independent Meal Prep:  Independent Community mobility: driving Medication management:  Independent Financial management: No change Handwriting: 75% legible: Pt. reports decreased  legibility when multitasking   MOBILITY STATUS: Independent  POSTURE COMMENTS:  Pectoral tightness with rounded shoulders Sitting balance:  I Unsupported static, and dynamic: good  ACTIVITY TOLERANCE: Activity tolerance: WFL  FUNCTIONAL OUTCOME MEASURES:  FOTO: 92 Timed Up, and Go  (TUG) Score: 8 sec.;  10/19/22: 8 sec 5 Times sit to stand Score: 12 sec. (Pt. did not come into a full stand for the last rep. 2/2 foot placement); 10/19/22: 13 sec (came to full stand) 6-Minute Walk Test Score: 1875 ft.; 10/19/22: 2,147 ft Berg Balance Scale Score: 56; 10/19/22: 56 Freezing of Gait Questionnaire Score: 0; 10/19/22: 0   Bilateral  Hand Tremors with the left greater than right. Pt. Reports tremors are worse with dual or multitasking, also when under pressure.   UPPER EXTREMITY ROM:    Active ROM Right Eval WFL Left Eval [facility]  Shoulder flexion    Shoulder abduction    Shoulder adduction    Shoulder extension    Shoulder internal rotation    Shoulder external rotation    Elbow flexion    Elbow extension    Wrist flexion    Wrist extension    Wrist ulnar deviation    Wrist radial deviation    Wrist pronation    Wrist supination    (Blank rows = not tested)  UPPER EXTREMITY MMT:     MMT Right eval Left eval  Shoulder flexion 5/5 5/5  Shoulder abduction 5/5 5/5  Shoulder adduction    Shoulder extension    Shoulder internal rotation    Shoulder external rotation    Middle trapezius    Lower trapezius    Elbow flexion 5/5 5/5  Elbow extension 5/5 5/5  Wrist flexion    Wrist extension 5/5 5/5  Wrist ulnar deviation    Wrist radial deviation    Wrist pronation    Wrist supination    (Blank rows = not tested)  HAND FUNCTION: Grip strength: Right: 112 lbs; Left: 110 lbs, Lateral pinch: Right: 30 lbs, Left: 26 lbs, and 3 point pinch: Right: 23 lbs, Left: 23 lbs 10/19/22: R grip 94, L 97 (reports hand fatigue from mowing lawn and blowing leaves today) , R lateral 28; L 27, 3 point pinch, 19, L 22  COORDINATION: 9 Hole Peg test: Right: 26 sec; Left: 24 sec  with Left hand tremors noted  10th visit: R : 29 sec; L 33    SENSATION: Numbness in the bilateral 2nd, 3rd, and 4th digits  EDEMA: None  COGNITION: Overall cognitive status: Within functional limits for tasks  assessed  VISION:  WFL for tasks assessed  PERCEPTION: WFL  PRAXIS: WFL   TODAY'S TREATMENT:                                                                                                                              DATE:  10/15/2022  Pt. tolerated pectoral stretches in standing at the wall for feedback. Pectoral  stretches were performed to promote posture, and alignment.    LSVT:  Patient was seen for LSVT Daily Session Maximal Daily Exercises for facilitation, and coordination of movement.   Maximum Sustained Movements are designed to rescale the amplitude of movement output for generalization to daily functional activities.Pt. Performed as follows for 1 set of 10 repetitions each multi-directional sustained movements:  1) Floor to ceiling  2) Side to side multidirectional   Multi-directional repetitive movements performed in standing and are designed to provide retraining effort needed for sustained muscle activation in tasks. Performed as follows for 1 set of 10 repetitions each of multi-directional repetitive movements:  3) Step and reach forward step 4) Step and reach sideways step   5) Step and reach backwards step 6) Rock and reach forward/backward  7) Rock and reach sideways   Identified Functional Component Tasks include:  Sit to stand BIG functional component task with supervision 5 reps  2. BIG reciprocal arm swing 3. Extending right shoulder to reach for items behind him. 4. Stepping over an obstacle 5. Writing   Pt. worked on Improving the amplitude of bilateral arm swing holding bilateral 3# hand weight, alternating arm swing with the end point to the wall as a target for feedback. Pt. performed reps of alternating shoulder extension with red level resistive theraband to an end point target at the wall. Pt. worked on multiple reps of reaching behind him on the left to retrieve a ball, then passing it to the right hand, and throwing it underhand into a target  basket. Emphasis was placed on swinging the right UE posteriorly to reach a target before following through with throwing it underhand towards a basket.  BIG ambulation:     1355 ft. with increased cues being required for right arm swing, and to increase amplitude of movements. Pt. worked on [employer], and [employer]. Emphasis was placed on right arm swing when increasing the challenge while dual tasking with conversation  Pt. worked on Administrator, arts with BIG upright midline posture in standing using a large BOSE balance disc dome while reaching at various planes, and across midline. Pt. Worked on reps of high BIG knee marching in place on a 4" foam uneven surface. Pt. Worked on standing on one leg with each the right and left legs while on the 4" uneven surface, followed by balancing on a steady flat ground surface. Pt. required cues for upright posture during the the task, increase the amplitude of movements, and upright posture during each of these exercises.     Pt. presented with improved amplitude of arm movements during the Multidirectional Maximal Daily Exercises requiring fewer cues today.  Pt. did not require a target for right UE form during the sideways step and reach Maximal Daily Exercise. Pt. Required less cues with proper form when dual tasking with counting, and talking during the floor to ceiling MDE. Pt. performs Hand/finger flicks for floor to ceiling MDE.  Pt. continues to require cues to increase the amplitude of right arm swing during BIG walking, and upright posture. Pt. required UE support initially at the parallel bars when balancing with the BOSE Disc. As the task progressed, the Pt. was able to maintain upright midline posture without UE support. Pt. presented with increased LOB when balancing on one leg, and performing high knee marching on the 4" unsteady surface. Pt. continues to benefit from OT services for LSVT to improve the amplitude of movements, improve posture, and  promote safe gait, and  overall function during ADLs, and IADL tasks.       PATIENT EDUCATION: Education details:  Sustained, and multi-directional  Maximal Daily exercises.  Person educated: Patient Education method: Customer service manager Education comprehension: verbalized understanding, returned demonstration, and needs further education  HOME EXERCISE PROGRAM:  GOALS: Goals reviewed with patient? Yes   SHORT TERM GOALS: Target date: 10/19/2022     Pt. Will independently demonstrate the proper techniques for the Maximal Daily Exercise HEP. Baseline: Pt. Currently does not have; min vc for form, technique, and dual tasking with counting  Goal status: ongoing     LONG TERM GOALS: Target date: 11/09/2022     Pt. Will increase the FOTO score by 2 points to reflect Pt. perceived improvement with assessment specific ADL, and IADLs. Baseline: FOTO: 92; 10/19/22: 96.7 Goal status:    2.  Pt. Will initiate, and sustain bilateral reciprocal arm swing 100% of the time during the 6 min. Walk test to improve posture, and gait performance. Baseline: Eval: Pt. Presents with limited arm swing  during gait; 10/19/22: Pt requires min-mod vc to sustain reciprocal arm swing and erect posture  Goal status: ongoing    3.  Pt. Will assume fully upright midline standing posture 100% of the time for all reps performed, and timed on the 5 times sit to stand test. Baseline: Eval: Forward trunk, head and neck posture, the 5th rep Pt. Did not assume full upright midline posture (half stand); 10/19/22: pt performs with initial min vc for technique Goal status: ongoing   4.  Pt. Will improve writing skills in order to be able to independently, legibly, and efficiently fill out checks while under pressure, and multitasking.  Baseline: Eval: Pt. Is able to write his name with 75% legibility. Pt. Reports legibility changes, and tremors when writing out checks while under pressure; 10/19/22: pt  reports legibility declines when fatigued, but using finger flicks and taking a break is helpful.  Goal status: ongoing ASSESSMENT:  CLINICAL IMPRESSION:   Pt. presented with improved amplitude of arm movements during the Multidirectional Maximal Daily Exercises requiring fewer cues today.  Pt. did not require a target for right UE form during the sideways step and reach Maximal Daily Exercise. Pt. Required less cues with proper form when dual tasking with counting, and talking during the floor to ceiling MDE. Pt. performs Hand/finger flicks for floor to ceiling MDE.  Pt. continues to require cues to increase the amplitude of right arm swing during BIG walking, and upright posture. Pt. required UE support initially at the parallel bars when balancing with the BOSE Disc. As the task progressed, the Pt. was able to maintain upright midline posture without UE support. Pt. presented with increased LOB when balancing on one leg, and performing high knee marching on the 4" unsteady surface. Pt. continues to benefit from OT services for LSVT to improve the amplitude of movements, improve posture, and promote safe gait, and overall function during ADLs, and IADL tasks.   Marland Kitchen    PERFORMANCE DEFICITS: in functional skills including ADLs, IADLs, coordination, dexterity, ROM, strength, pain, Fine motor control, Gross motor control, body mechanics, endurance, decreased knowledge of use of DME, and UE functional use, cognitive skills, and psychosocial skills including coping strategies, environmental adaptation, habits, and routines and behaviors.   IMPAIRMENTS: are limiting patient from ADLs, IADLs, and leisure.   CO-MORBIDITIES; may have co-morbidities  that affects occupational performance. Patient will benefit from skilled OT to address above impairments and improve overall function.  MODIFICATION OR ASSISTANCE TO COMPLETE EVALUATION: Min-Moderate modification of tasks or assist with assess necessary to  complete an evaluation.  OT OCCUPATIONAL PROFILE AND HISTORY: Detailed assessment: Review of records and additional review of physical, cognitive, psychosocial history related to current functional performance.  CLINICAL DECISION MAKING: Moderate - several treatment options, min-mod task modification necessary  REHAB POTENTIAL: Good  EVALUATION COMPLEXITY: Moderate    PLAN:  OT FREQUENCY: 4x/week  OT DURATION: 6 weeks  PLANNED INTERVENTIONS: self care/ADL training, therapeutic exercise, therapeutic activity, neuromuscular re-education, manual therapy, passive range of motion, and paraffin, balance training  RECOMMENDED OTHER SERVICES: N/A  CONSULTED AND AGREED WITH PLAN OF CARE: Patient  PLAN FOR NEXT SESSION:  Continues to work on form and technique with the Maximal Daily exercises.   Harrel Carina, MS, OTR/L   Harrel Carina, OT 10/21/2022, 2:52 PM

## 2022-10-26 ENCOUNTER — Ambulatory Visit: Payer: Medicare HMO | Admitting: Occupational Therapy

## 2022-10-26 DIAGNOSIS — R278 Other lack of coordination: Secondary | ICD-10-CM

## 2022-10-26 DIAGNOSIS — M6281 Muscle weakness (generalized): Secondary | ICD-10-CM | POA: Diagnosis not present

## 2022-10-26 DIAGNOSIS — G20B2 Parkinson's disease with dyskinesia, with fluctuations: Secondary | ICD-10-CM | POA: Diagnosis not present

## 2022-10-26 NOTE — Therapy (Signed)
OUTPATIENT OCCUPATIONAL THERAPY NEURO TREATMENT  Patient Name: Robert Fuller MRN: [id number] DOB:06-19-1951, 71 y.o., male Today's Date: 10/26/2022  PCP: Dr. Miguel Aschoff, MD REFERRING PROVIDER: Dr. Jennings Books, MD    OT End of Session - 10/26/22 1704     Visit Number 13    Number of Visits 17    Date for OT Re-Evaluation 11/09/22    Authorization Time Period Progress reporting period starting 09/28/2022    OT Start Time 1602    OT Stop Time 1702    OT Time Calculation (min) 60 min    Activity Tolerance Patient tolerated treatment well    Behavior During Therapy [facility] for tasks assessed/performed                         Past Medical History:  Diagnosis Date   Barrett's esophagus    Cough    Esophageal reflux    Kidney stone    Past Surgical History:  Procedure Laterality Date   COLONOSCOPY WITH PROPOFOL N/A 07/21/2022   Procedure: COLONOSCOPY WITH PROPOFOL;  Surgeon: [name], MD;  Location: [facility] ENDOSCOPY;  Service: Endoscopy;  Laterality: N/A;   ESOPHAGOGASTRODUODENOSCOPY (EGD) WITH PROPOFOL N/A 04/06/2016   Procedure: ESOPHAGOGASTRODUODENOSCOPY (EGD) WITH PROPOFOL;  Surgeon: Hulen Luster, MD;  Location: [facility] ENDOSCOPY;  Service: Gastroenterology;  Laterality: N/A;   ESOPHAGOGASTRODUODENOSCOPY (EGD) WITH PROPOFOL N/A 07/21/2022   Procedure: ESOPHAGOGASTRODUODENOSCOPY (EGD) WITH PROPOFOL;  Surgeon: [name], MD;  Location: [facility] ENDOSCOPY;  Service: Endoscopy;  Laterality: N/A;   NO PAST SURGERIES     UPPER GI ENDOSCOPY  04/06/16   normal other than esophageal changes present consistent with barretts esophagus, repeat 2019   Patient Active Problem List   Diagnosis Date Noted   ED (erectile dysfunction) of organic origin 08/04/2016   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 Reports having a rezoning planning meeting today.  SUBJECTIVE STATEMENT:  Pt. reports that he just found out he has Parkinson's disease at his last MD visit a couple of weeks ago. Pt accompanied by: self  PERTINENT HISTORY:   Pt. is a 71 y.o. male who was recently diagnosed with Parkinson's Disease with bilateral hand tremors on 09/14/2022. Pt. has bilateral Carpal Tunnel Syndrome with numbness in digits of his bilateral hands.   PRECAUTIONS: None  WEIGHT BEARING RESTRICTIONS: No  PAIN:  Are you having pain? No  FALLS: Has patient fallen in last 6 months? No  LIVING ENVIRONMENT: Lives with: lives with their spouse Lives in: House/apartment Stairs: Yes: Internal: 3 floors steps; on right going up and on left going up 3 stories Has following equipment at home: Grab bars  PLOF: Independent  PATIENT GOALS: To improve amplitude of movement  OBJECTIVE:   HAND DOMINANCE: Right  ADLs: Overall ADLs:  Independent Transfers/ambulation related to ADLs: Eating: Independent Grooming:  Independent UB Dressing: Independent LB Dressing: Independent Toileting: Independent Bathing: Independent Tub Shower transfers:  Independent no grab bars Equipment: Grab bars  IADLs: Shopping: Independent Light housekeeping:  Independent Meal Prep:  Independent Community mobility: driving Medication management:  Independent Financial management: No change Handwriting: 75% legible: Pt. reports decreased  legibility when multitasking   MOBILITY STATUS: Independent  POSTURE COMMENTS:  Pectoral tightness with rounded shoulders Sitting balance:  I Unsupported static, and dynamic: good  ACTIVITY TOLERANCE: Activity tolerance: WFL  FUNCTIONAL OUTCOME MEASURES:  FOTO: 92 Timed Up, and Go  (TUG) Score: 8 sec.;  10/19/22: 8 sec 5 Times sit to stand Score: 12 sec. (Pt. did not come into a full stand for the last rep. 2/2 foot placement); 10/19/22: 13 sec (came to full stand) 6-Minute Walk Test Score: 1875 ft.; 10/19/22: 2,147 ft Berg Balance Scale Score: 56; 10/19/22: 56 Freezing of Gait Questionnaire Score: 0; 10/19/22: 0   Bilateral  Hand Tremors with the left greater than right. Pt. Reports tremors are worse with dual or multitasking, also when under pressure.   UPPER EXTREMITY ROM:    Active ROM Right Eval WFL Left Eval [facility]  Shoulder flexion    Shoulder abduction    Shoulder adduction    Shoulder extension    Shoulder internal rotation    Shoulder external rotation    Elbow flexion    Elbow extension    Wrist flexion    Wrist extension    Wrist ulnar deviation    Wrist radial deviation    Wrist pronation    Wrist supination    (Blank rows = not tested)  UPPER EXTREMITY MMT:     MMT Right eval Left eval  Shoulder flexion 5/5 5/5  Shoulder abduction 5/5 5/5  Shoulder adduction    Shoulder extension    Shoulder internal rotation    Shoulder external rotation    Middle trapezius    Lower trapezius    Elbow flexion 5/5 5/5  Elbow extension 5/5 5/5  Wrist flexion    Wrist extension 5/5 5/5  Wrist ulnar deviation    Wrist radial deviation    Wrist pronation    Wrist supination    (Blank rows = not tested)  HAND FUNCTION: Grip strength: Right: 112 lbs; Left: 110 lbs, Lateral pinch: Right: 30 lbs, Left: 26 lbs, and 3 point pinch: Right: 23 lbs, Left: 23 lbs 10/19/22: R grip 94, L 97 (reports hand fatigue from mowing lawn and blowing leaves today) , R lateral 28; L 27, 3 point pinch, 19, L 22  COORDINATION: 9 Hole Peg test: Right: 26 sec; Left: 24 sec  with Left hand tremors noted  10th visit: R : 29 sec; L 33    SENSATION: Numbness in the bilateral 2nd, 3rd, and 4th digits  EDEMA: None  COGNITION: Overall cognitive status: Within functional limits for tasks  assessed  VISION:  WFL for tasks assessed  PERCEPTION: WFL  PRAXIS: WFL   TODAY'S TREATMENT:                                                                                                                              DATE:  10/26/2022  Pt. tolerated pectoral stretches in standing at the wall for feedback. Pectoral  stretches were performed to promote posture, and alignment.    LSVT:  Patient was seen for LSVT Daily Session Maximal Daily Exercises for facilitation, and coordination of movement.   Maximum Sustained Movements are designed to rescale the amplitude of movement output for generalization to daily functional activities.Pt. Performed as follows for 1 set of 10 repetitions each multi-directional sustained movements:  1) Floor to ceiling  2) Side to side multidirectional   Multi-directional repetitive movements performed in standing and are designed to provide retraining effort needed for sustained muscle activation in tasks. Performed as follows for 1 set of 10 repetitions each of multi-directional repetitive movements:  3) Step and reach forward step 4) Step and reach sideways step   5) Step and reach backwards step 6) Rock and reach forward/backward  7) Rock and reach sideways   Identified Functional Component Tasks include:  Sit to stand BIG functional component task with supervision 5 reps  2. BIG reciprocal arm swing 3. Extending right shoulder to reach for items behind him. 4. Stepping over an obstacle 5. Writing   Pt. worked on Improving the amplitude of bilateral arm swing holding bilateral 3# hand weight, alternating arm swing with the end point to the wall as a target for feedback.  BIG ambulation:     1355 ft. with increased cues being required for right arm swing, and to increase amplitude of movements. Pt. worked on [employer], and [employer]. A weighted bar was utilized for the last 400 feet to provide feedback for the right arm swing during BIG  walking. Emphasis was placed on right arm swing when increasing the challenge while dual tasking with conversation  Pt. worked on Administrator, arts with BIG upright midline posture in standing using a balance board in the parallel bars. Pt. Worked on reps of step ups with high knees onto an Aerobic step board with no risers. Pt. Then performed reps of straddling the board with alternating reps of high knees on and off of the board without board risers. Pt. required cues to increase the amplitude of movements, and upright posture during each of these exercises.          Pt. Continues to present with improved amplitude of arm movements during the Multidirectional Maximal Daily Exercises requiring fewer cues today.  Pt. Required cues only initially for right UE form during the sideways step and reach Maximal Daily Exercise. Pt. Requires less cues for proper form when dual tasking with counting, and talking during the floor to ceiling MDE. Pt. performs Hand/finger flicks for floor to ceiling MDE.  Pt. continues to require cues to increase the amplitude of right arm swing during BIG walking, and upright posture. Pt. Responded well to utilizing the bar for right posterior arm swing. Pt. Presented with difficulty achieving upright midline balance on the balance board this afternoon. Pt. Was able to perform the repetitive step board task with increased amplitude of movement with higher knees, and more coordinated sequencing through the straddling step board task. Pt. continues to benefit from OT services for LSVT to improve the amplitude of movements, improve posture, and promote safe gait, and overall function during ADLs, and IADL tasks.       PATIENT EDUCATION: Education details:  Sustained, and multi-directional  Maximal Daily exercises.  Person educated: Patient Education method: Customer service manager Education comprehension: verbalized understanding, returned demonstration, and needs further  education  HOME EXERCISE PROGRAM:  GOALS: Goals reviewed with patient? Yes   SHORT TERM GOALS: Target date:  10/19/2022     Pt. Will independently demonstrate the proper techniques for the Maximal Daily Exercise HEP. Baseline: Pt. Currently does not have; min vc for form, technique, and dual tasking with counting  Goal status: ongoing     LONG TERM GOALS: Target date: 11/09/2022     Pt. Will increase the FOTO score by 2 points to reflect Pt. perceived improvement with assessment specific ADL, and IADLs. Baseline: FOTO: 92; 10/19/22: 96.7 Goal status:    2.  Pt. Will initiate, and sustain bilateral reciprocal arm swing 100% of the time during the 6 min. Walk test to improve posture, and gait performance. Baseline: Eval: Pt. Presents with limited arm swing  during gait; 10/19/22: Pt requires min-mod vc to sustain reciprocal arm swing and erect posture  Goal status: ongoing    3.  Pt. Will assume fully upright midline standing posture 100% of the time for all reps performed, and timed on the 5 times sit to stand test. Baseline: Eval: Forward trunk, head and neck posture, the 5th rep Pt. Did not assume full upright midline posture (half stand); 10/19/22: pt performs with initial min vc for technique Goal status: ongoing   4.  Pt. Will improve writing skills in order to be able to independently, legibly, and efficiently fill out checks while under pressure, and multitasking.  Baseline: Eval: Pt. Is able to write his name with 75% legibility. Pt. Reports legibility changes, and tremors when writing out checks while under pressure; 10/19/22: pt reports legibility declines when fatigued, but using finger flicks and taking a break is helpful.  Goal status: ongoing ASSESSMENT:  CLINICAL IMPRESSION:   Pt. Continues to present with improved amplitude of arm movements during the Multidirectional Maximal Daily Exercises requiring fewer cues today.  Pt. Required cues only initially for right UE  form during the sideways step and reach Maximal Daily Exercise. Pt. Requires less cues for proper form when dual tasking with counting, and talking during the floor to ceiling MDE. Pt. performs Hand/finger flicks for floor to ceiling MDE.  Pt. continues to require cues to increase the amplitude of right arm swing during BIG walking, and upright posture. Pt. Responded well to utilizing the bar for right posterior arm swing. Pt. Presented with difficulty achieving upright midline balance on the balance board this afternoon. Pt. Was able to perform the repetitive step board task with increased amplitude of movement with higher knees, and more coordinated sequencing through the straddling step board task. Pt. continues to benefit from OT services for LSVT to improve the amplitude of movements, improve posture, and promote safe gait, and overall function during ADLs, and IADL tasks.  Marland Kitchen    PERFORMANCE DEFICITS: in functional skills including ADLs, IADLs, coordination, dexterity, ROM, strength, pain, Fine motor control, Gross motor control, body mechanics, endurance, decreased knowledge of use of DME, and UE functional use, cognitive skills, and psychosocial skills including coping strategies, environmental adaptation, habits, and routines and behaviors.   IMPAIRMENTS: are limiting patient from ADLs, IADLs, and leisure.   CO-MORBIDITIES; may have co-morbidities  that affects occupational performance. Patient will benefit from skilled OT to address above impairments and improve overall function.  MODIFICATION OR ASSISTANCE TO COMPLETE EVALUATION: Min-Moderate modification of tasks or assist with assess necessary to complete an evaluation.  OT OCCUPATIONAL PROFILE AND HISTORY: Detailed assessment: Review of records and additional review of physical, cognitive, psychosocial history related to current functional performance.  CLINICAL DECISION MAKING: Moderate - several treatment options, min-mod task  modification necessary  REHAB POTENTIAL: Good  EVALUATION COMPLEXITY: Moderate    PLAN:  OT FREQUENCY: 4x/week  OT DURATION: 6 weeks  PLANNED INTERVENTIONS: self care/ADL training, therapeutic exercise, therapeutic activity, neuromuscular re-education, manual therapy, passive range of motion, and paraffin, balance training  RECOMMENDED OTHER SERVICES: N/A  CONSULTED AND AGREED WITH PLAN OF CARE: Patient  PLAN FOR NEXT SESSION:  Continues to work on form and technique with the Maximal Daily exercises.   Harrel Carina, MS, OTR/L   Harrel Carina, OT 10/26/2022, 5:28 PM

## 2022-10-27 ENCOUNTER — Ambulatory Visit: Payer: Medicare HMO | Admitting: Occupational Therapy

## 2022-10-27 DIAGNOSIS — M6281 Muscle weakness (generalized): Secondary | ICD-10-CM

## 2022-10-27 DIAGNOSIS — R278 Other lack of coordination: Secondary | ICD-10-CM | POA: Diagnosis not present

## 2022-10-27 DIAGNOSIS — G20B2 Parkinson's disease with dyskinesia, with fluctuations: Secondary | ICD-10-CM | POA: Diagnosis not present

## 2022-10-27 NOTE — Therapy (Signed)
OUTPATIENT OCCUPATIONAL THERAPY NEURO TREATMENT  Patient Name: Robert Fuller MRN: [id number] DOB:1951-08-28, 71 y.o., male Today's Date: 10/26/2022  PCP: Dr. Miguel Aschoff, MD REFERRING PROVIDER: Dr. Jennings Books, MD       OT End of Session     Visit Number 14   Number of Visits 17    Date for OT Re-Evaluation 11/09/22    Authorization Time Period Progress reporting period starting 09/28/2022    OT Start Time 1605    OT Stop Time 1700   OT Time Calculation (min) 55 min    Activity Tolerance Patient tolerated treatment well    Behavior During Therapy [facility] for tasks assessed/performed                         Past Medical History:  Diagnosis Date   Barrett's esophagus    Cough    Esophageal reflux    Kidney stone    Past Surgical History:  Procedure Laterality Date   COLONOSCOPY WITH PROPOFOL N/A 07/21/2022   Procedure: COLONOSCOPY WITH PROPOFOL;  Surgeon: [name], MD;  Location: [facility] ENDOSCOPY;  Service: Endoscopy;  Laterality: N/A;   ESOPHAGOGASTRODUODENOSCOPY (EGD) WITH PROPOFOL N/A 04/06/2016   Procedure: ESOPHAGOGASTRODUODENOSCOPY (EGD) WITH PROPOFOL;  Surgeon: Hulen Luster, MD;  Location: [facility] ENDOSCOPY;  Service: Gastroenterology;  Laterality: N/A;   ESOPHAGOGASTRODUODENOSCOPY (EGD) WITH PROPOFOL N/A 07/21/2022   Procedure: ESOPHAGOGASTRODUODENOSCOPY (EGD) WITH PROPOFOL;  Surgeon: [name], MD;  Location: [facility] ENDOSCOPY;  Service: Endoscopy;  Laterality: N/A;   NO PAST SURGERIES     UPPER GI ENDOSCOPY  04/06/16   normal other than esophageal changes present consistent with barretts esophagus, repeat 2019   Patient Active Problem List   Diagnosis Date Noted   ED (erectile dysfunction) of organic origin 08/04/2016   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  Reports doing well today.  SUBJECTIVE STATEMENT:  Pt. reports that he just found out he has Parkinson's disease at his last MD visit a couple of weeks ago. Pt accompanied by: self  PERTINENT HISTORY:   Pt. is a 71 y.o. male who was recently diagnosed with Parkinson's Disease with bilateral hand tremors on 09/14/2022. Pt. has bilateral Carpal Tunnel Syndrome with numbness in digits of his bilateral hands.   PRECAUTIONS: None  WEIGHT BEARING RESTRICTIONS: No  PAIN:  Are you having pain? Positive pain in fingers following snapping with bilateral hands, NR  FALLS: Has patient fallen in last 6 months? No  LIVING ENVIRONMENT: Lives with: lives with their spouse Lives in: House/apartment Stairs: Yes: Internal: 3 floors steps; on right going up and on left going up 3 stories Has following equipment at home: Grab bars  PLOF: Independent  PATIENT GOALS: To improve amplitude of movement  OBJECTIVE:   HAND DOMINANCE: Right  ADLs: Overall ADLs:  Independent Transfers/ambulation related to ADLs: Eating: Independent Grooming:  Independent UB Dressing: Independent LB Dressing: Independent Toileting: Independent Bathing: Independent Tub Shower transfers:  Independent no grab bars Equipment: Grab bars  IADLs: Shopping: Independent Light housekeeping:  Independent Meal Prep:  Independent Community mobility: driving Medication management:  Independent Financial management: No change Handwriting:  75% legible: Pt. reports decreased legibility when multitasking   MOBILITY STATUS: Independent  POSTURE COMMENTS:  Pectoral tightness with rounded shoulders Sitting balance:  I Unsupported static, and dynamic: good  ACTIVITY TOLERANCE: Activity tolerance: WFL  FUNCTIONAL OUTCOME MEASURES:  FOTO: 92 Timed Up, and Go  (TUG)  Score: 8 sec.; 10/19/22: 8 sec 5 Times sit to stand Score: 12 sec. (Pt. did not come into a full stand for the last rep. 2/2 foot placement); 10/19/22: 13 sec (came to full stand) 6-Minute Walk Test Score: 1875 ft.; 10/19/22: 2,147 ft Berg Balance Scale Score: 56; 10/19/22: 56 Freezing of Gait Questionnaire Score: 0; 10/19/22: 0   Bilateral  Hand Tremors with the left greater than right. Pt. Reports tremors are worse with dual or multitasking, also when under pressure.   UPPER EXTREMITY ROM:    Active ROM Right Eval WFL Left Eval [facility]  Shoulder flexion    Shoulder abduction    Shoulder adduction    Shoulder extension    Shoulder internal rotation    Shoulder external rotation    Elbow flexion    Elbow extension    Wrist flexion    Wrist extension    Wrist ulnar deviation    Wrist radial deviation    Wrist pronation    Wrist supination    (Blank rows = not tested)  UPPER EXTREMITY MMT:     MMT Right eval Left eval  Shoulder flexion 5/5 5/5  Shoulder abduction 5/5 5/5  Shoulder adduction    Shoulder extension    Shoulder internal rotation    Shoulder external rotation    Middle trapezius    Lower trapezius    Elbow flexion 5/5 5/5  Elbow extension 5/5 5/5  Wrist flexion    Wrist extension 5/5 5/5  Wrist ulnar deviation    Wrist radial deviation    Wrist pronation    Wrist supination    (Blank rows = not tested)  HAND FUNCTION: Grip strength: Right: 112 lbs; Left: 110 lbs, Lateral pinch: Right: 30 lbs, Left: 26 lbs, and 3 point pinch: Right: 23 lbs, Left: 23 lbs 10/19/22: R grip 94, L 97 (reports hand fatigue from mowing lawn and blowing leaves today) , R lateral 28; L 27, 3 point pinch, 19, L 22  COORDINATION: 9 Hole Peg test: Right: 26 sec; Left: 24 sec  with Left hand tremors noted  10th visit: R : 29 sec; L 33    SENSATION: Numbness in the bilateral 2nd, 3rd, and 4th digits  EDEMA: None  COGNITION: Overall cognitive status: Within functional limits  for tasks assessed  VISION:  [facility] for tasks assessed  PERCEPTION: WFL  PRAXIS: WFL   TODAY'S TREATMENT:                                                                                                                              DATE:  10/27/2022  Pt. tolerated pectoral stretches in standing at  the wall for feedback. Pectoral stretches were performed to promote posture, and alignment.    LSVT:  Patient was seen for LSVT Daily Session Maximal Daily Exercises for facilitation, and coordination of movement.   Maximum Sustained Movements are designed to rescale the amplitude of movement output for generalization to daily functional activities.Pt. Performed as follows for 1 set of 10 repetitions each multi-directional sustained movements:  1) Floor to ceiling  2) Side to side multidirectional   Multi-directional repetitive movements performed in standing and are designed to provide retraining effort needed for sustained muscle activation in tasks. Performed as follows for 1 set of 10 repetitions each of multi-directional repetitive movements:  3) Step and reach forward step 4) Step and reach sideways step   5) Step and reach backwards step 6) Rock and reach forward/backward  7) Rock and reach sideways   Identified Functional Component Tasks include:  Sit to stand BIG functional component task with supervision 5 reps  2. BIG reciprocal arm swing 3. Extending right shoulder to reach for items behind him. 4. Stepping over an obstacle 5. Writing   Pt. worked on Improving the amplitude of bilateral arm swing holding bilateral 3# hand weight, alternating arm swing with the end point to the wall as a target for feedback.  BIG ambulation:     1355 ft. with increased cues being required for right arm swing, and to increase amplitude of movements. Pt. worked on [employer], and [employer]. A weighted bar was utilized feet to provide for the right arm swing during BIG walking. Emphasis  was placed on right arm swing when increasing the challenge while dual tasking with conversation  Pt. worked on Administrator, arts with BIG upright midline posture in standing using a wood plank balance board with support at a single balance bar.  Pt. Worked on stepping in a line one foot in front of the other touching heel to toe. Pt. Performed reps at both slow, and fast pace. Pt. Worked on writing skills and a vertical board while standing at an uneven surface using a 4" foam  board.       Pt. Continues to present with improved amplitude of arm movements during the Multidirectional Maximal Daily Exercises requiring fewer cues today.  Pt. Required cues only initially for right UE form during the sideways step and reach Maximal Daily Exercise. Pt. Requires less cues for proper form when dual tasking with counting, and talking during the floor to ceiling MDE. Pt. performs Hand/finger flicks for floor to ceiling MDE. Snapping bilateral fingers were added to every other forward step, and reach. Pt. Reported snapping was painful for his fingers.  Pt. continues to require cues to increase the amplitude of right arm swing during BIG walking, and upright posture. Pt. Responded well to utilizing the bar for right posterior arm swing. Pt. Continues to Present with difficulty achieving upright midline balance on the balance board. Writing legibility, and figure eights were more legible today with No loss of balance on the foam wedge. Positive LOB when walking slowly heel to toe in a straight line. Pt. Was able to perform the repetitive step board task with increased amplitude of movement with higher knees, and more coordinated sequencing through the straddling step board task. Pt. continues to benefit from OT services for LSVT to improve the amplitude of movements, improve posture, and promote safe gait, and overall function during ADLs, and IADL tasks.     PATIENT EDUCATION: Education details:  Sustained, and  multi-directional  Maximal Daily exercises.  Person educated: Patient Education method: Customer service manager Education comprehension: verbalized understanding, returned demonstration, and needs further education  HOME EXERCISE PROGRAM:  GOALS: Goals reviewed with patient? Yes   SHORT TERM GOALS: Target date: 10/19/2022     Pt. Will independently demonstrate the proper techniques for the Maximal Daily Exercise HEP. Baseline: Pt. Currently does not have; min vc for form, technique, and dual tasking with counting  Goal status: ongoing     LONG TERM GOALS: Target date: 11/09/2022     Pt. Will increase the FOTO score by 2 points to reflect Pt. perceived improvement with assessment specific ADL, and IADLs. Baseline: FOTO: 92; 10/19/22: 96.7 Goal status:    2.  Pt. Will initiate, and sustain bilateral reciprocal arm swing 100% of the time during the 6 min. Walk test to improve posture, and gait performance. Baseline: Eval: Pt. Presents with limited arm swing  during gait; 10/19/22: Pt requires min-mod vc to sustain reciprocal arm swing and erect posture  Goal status: ongoing    3.  Pt. Will assume fully upright midline standing posture 100% of the time for all reps performed, and timed on the 5 times sit to stand test. Baseline: Eval: Forward trunk, head and neck posture, the 5th rep Pt. Did not assume full upright midline posture (half stand); 10/19/22: pt performs with initial min vc for technique Goal status: ongoing   4.  Pt. Will improve writing skills in order to be able to independently, legibly, and efficiently fill out checks while under pressure, and multitasking.  Baseline: Eval: Pt. Is able to write his name with 75% legibility. Pt. Reports legibility changes, and tremors when writing out checks while under pressure; 10/19/22: pt reports legibility declines when fatigued, but using finger flicks and taking a break is helpful.  Goal status:  ongoing ASSESSMENT:  CLINICAL IMPRESSION:   Pt. Continues to present with improved amplitude of arm movements during the Multidirectional Maximal Daily Exercises requiring fewer cues today.  Pt. Required cues only initially for right UE form during the sideways step and reach Maximal Daily Exercise. Pt. Requires less cues for proper form when dual tasking with counting, and talking during the floor to ceiling MDE. Pt. performs Hand/finger flicks for floor to ceiling MDE. Snapping bilateral fingers were added to every other forward step, and reach. Pt. Reported snapping was painful for his fingers.  Pt. continues to require cues to increase the amplitude of right arm swing during BIG walking, and upright posture. Pt. Responded well to utilizing the bar for right posterior arm swing. Pt. Continues to Present with difficulty achieving upright midline balance on the balance board. Writing legibility, and figure eights were more legible today with No loss of balance on the foam wedge. Positive LOB when walking slowly heel to toe in a straight line. Pt. Was able to perform the repetitive step board task with increased amplitude of movement with higher knees, and more coordinated sequencing through the straddling step board task. Pt. continues to benefit from OT services for LSVT to improve the amplitude of movements, improve posture, and promote safe gait, and overall function during ADLs, and IADL tasks.  Marland Kitchen    PERFORMANCE DEFICITS: in functional skills including ADLs, IADLs, coordination, dexterity, ROM, strength, pain, Fine motor control, Gross motor control, body mechanics, endurance, decreased knowledge of use of DME, and UE functional use, cognitive skills, and psychosocial skills including coping strategies, environmental adaptation, habits, and routines and behaviors.   IMPAIRMENTS: are limiting  patient from ADLs, IADLs, and leisure.   CO-MORBIDITIES; may have co-morbidities  that affects occupational  performance. Patient will benefit from skilled OT to address above impairments and improve overall function.  MODIFICATION OR ASSISTANCE TO COMPLETE EVALUATION: Min-Moderate modification of tasks or assist with assess necessary to complete an evaluation.  OT OCCUPATIONAL PROFILE AND HISTORY: Detailed assessment: Review of records and additional review of physical, cognitive, psychosocial history related to current functional performance.  CLINICAL DECISION MAKING: Moderate - several treatment options, min-mod task modification necessary  REHAB POTENTIAL: Good  EVALUATION COMPLEXITY: Moderate    PLAN:  OT FREQUENCY: 4x/week  OT DURATION: 6 weeks  PLANNED INTERVENTIONS: self care/ADL training, therapeutic exercise, therapeutic activity, neuromuscular re-education, manual therapy, passive range of motion, and paraffin, balance training  RECOMMENDED OTHER SERVICES: N/A  CONSULTED AND AGREED WITH PLAN OF CARE: Patient  PLAN FOR NEXT SESSION:  Continues to work on form and technique with the Maximal Daily exercises.   Harrel Carina, MS, OTR/L   Harrel Carina, OT 10/26/2022, 5:28 PM

## 2022-10-28 ENCOUNTER — Ambulatory Visit: Payer: Medicare HMO

## 2022-10-28 DIAGNOSIS — R278 Other lack of coordination: Secondary | ICD-10-CM

## 2022-10-28 DIAGNOSIS — M6281 Muscle weakness (generalized): Secondary | ICD-10-CM

## 2022-10-28 DIAGNOSIS — G20B2 Parkinson's disease with dyskinesia, with fluctuations: Secondary | ICD-10-CM

## 2022-10-29 ENCOUNTER — Ambulatory Visit: Payer: Medicare HMO

## 2022-10-29 DIAGNOSIS — R278 Other lack of coordination: Secondary | ICD-10-CM

## 2022-10-29 DIAGNOSIS — M6281 Muscle weakness (generalized): Secondary | ICD-10-CM

## 2022-10-29 DIAGNOSIS — G20B2 Parkinson's disease with dyskinesia, with fluctuations: Secondary | ICD-10-CM | POA: Diagnosis not present

## 2022-10-29 NOTE — Therapy (Addendum)
OUTPATIENT OCCUPATIONAL THERAPY NEURO TREATMENT  Patient Name: Robert Fuller MRN: [id number] DOB:Mar 27, 1951, 71 y.o., male Today's Date: 10/29/2022  PCP: Dr. Miguel Aschoff, MD REFERRING PROVIDER: Dr. Jennings Books, MD   OT End of Session - 10/29/22 2205     Visit Number 16    Number of Visits 17    Date for OT Re-Evaluation 11/09/22    Authorization Time Period Progress reporting period starting 09/28/2022    OT Start Time 1515    OT Stop Time 1613    OT Time Calculation (min) 58 min    Activity Tolerance Patient tolerated treatment well    Behavior During Therapy [facility] for tasks assessed/performed                Past Medical History:  Diagnosis Date   Barrett's esophagus    Cough    Esophageal reflux    Kidney stone    Past Surgical History:  Procedure Laterality Date   COLONOSCOPY WITH PROPOFOL N/A 07/21/2022   Procedure: COLONOSCOPY WITH PROPOFOL;  Surgeon: [name], MD;  Location: [facility] ENDOSCOPY;  Service: Endoscopy;  Laterality: N/A;   ESOPHAGOGASTRODUODENOSCOPY (EGD) WITH PROPOFOL N/A 04/06/2016   Procedure: ESOPHAGOGASTRODUODENOSCOPY (EGD) WITH PROPOFOL;  Surgeon: Hulen Luster, MD;  Location: [facility] ENDOSCOPY;  Service: Gastroenterology;  Laterality: N/A;   ESOPHAGOGASTRODUODENOSCOPY (EGD) WITH PROPOFOL N/A 07/21/2022   Procedure: ESOPHAGOGASTRODUODENOSCOPY (EGD) WITH PROPOFOL;  Surgeon: [name], MD;  Location: [facility] ENDOSCOPY;  Service: Endoscopy;  Laterality: N/A;   NO PAST SURGERIES     UPPER GI ENDOSCOPY  04/06/16   normal other than esophageal changes present consistent with barretts esophagus, repeat 2019   Patient Active Problem List   Diagnosis Date Noted   ED (erectile dysfunction) of organic origin 08/04/2016   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  SUBJECTIVE STATEMENT:  Pt. reports that he just found out he has Parkinson's disease at his last MD visit a couple of weeks ago. Pt accompanied by: self  PERTINENT HISTORY:   Pt. is a 71 y.o. male who was recently diagnosed with Parkinson's Disease with bilateral hand tremors on 09/14/2022. Pt. has bilateral Carpal Tunnel Syndrome with numbness in digits of his bilateral hands.   PRECAUTIONS: None  WEIGHT BEARING RESTRICTIONS: No  PAIN:  Are you having pain? No  FALLS: Has patient fallen in last 6 months? No  LIVING ENVIRONMENT: Lives with: lives with their spouse Lives in: House/apartment Stairs: Yes: Internal: 3 floors steps; on right going up and on left going up 3 stories Has following equipment at home: Grab bars  PLOF: Independent  PATIENT GOALS: To improve amplitude of movement  OBJECTIVE:   HAND DOMINANCE: Right  ADLs: Overall ADLs:  Independent Transfers/ambulation related to ADLs: Eating: Independent Grooming:  Independent UB Dressing: Independent LB Dressing: Independent Toileting: Independent Bathing: Independent Tub Shower transfers:  Independent no grab bars Equipment: Grab bars  IADLs: Shopping: Independent Light housekeeping:  Independent Meal Prep:  Independent Community mobility: driving Medication management:  Independent Financial management: No change Handwriting: 75% legible: Pt. reports decreased legibility  when multitasking   MOBILITY STATUS: Independent  POSTURE COMMENTS:  Pectoral tightness with rounded shoulders Sitting balance:  I Unsupported static, and dynamic: good  ACTIVITY TOLERANCE: Activity tolerance: WFL  FUNCTIONAL OUTCOME MEASURES:  FOTO: 92 Timed Up, and Go  (TUG)  Score: 8 sec.; 10/19/22: 8 sec 5 Times sit to stand Score: 12 sec. (Pt. did not come into a full stand for the last rep. 2/2 foot placement); 10/19/22: 13 sec (came to full stand) 6-Minute Walk Test Score: 1875 ft.; 10/19/22: 2,147 ft Berg Balance Scale Score: 56; 10/19/22: 56 Freezing of Gait Questionnaire Score: 0; 10/19/22: 0   Bilateral  Hand Tremors with the left greater than right. Pt. Reports tremors are worse with dual or multitasking, also when under pressure.   UPPER EXTREMITY ROM:    Active ROM Right Eval WFL Left Eval [facility]  Shoulder flexion    Shoulder abduction    Shoulder adduction    Shoulder extension    Shoulder internal rotation    Shoulder external rotation    Elbow flexion    Elbow extension    Wrist flexion    Wrist extension    Wrist ulnar deviation    Wrist radial deviation    Wrist pronation    Wrist supination    (Blank rows = not tested)  UPPER EXTREMITY MMT:     MMT Right eval Left eval  Shoulder flexion 5/5 5/5  Shoulder abduction 5/5 5/5  Shoulder adduction    Shoulder extension    Shoulder internal rotation    Shoulder external rotation    Middle trapezius    Lower trapezius    Elbow flexion 5/5 5/5  Elbow extension 5/5 5/5  Wrist flexion    Wrist extension 5/5 5/5  Wrist ulnar deviation    Wrist radial deviation    Wrist pronation    Wrist supination    (Blank rows = not tested)  HAND FUNCTION: Grip strength: Right: 112 lbs; Left: 110 lbs, Lateral pinch: Right: 30 lbs, Left: 26 lbs, and 3 point pinch: Right: 23 lbs, Left: 23 lbs 10/19/22: R grip 94, L 97 (reports hand fatigue from mowing lawn and blowing leaves today) , R lateral 28; L 27, 3 point pinch, 19, L 22  COORDINATION: 9 Hole Peg test: Right: 26 sec; Left: 24 sec  with Left hand tremors noted  10th visit: R : 29 sec; L 33    SENSATION: Numbness in the bilateral 2nd, 3rd, and 4th digits  EDEMA: None  COGNITION: Overall cognitive status: Within functional limits  for tasks assessed  VISION:  WFL for tasks assessed  PERCEPTION: WFL  PRAXIS: WFL   TODAY'S TREATMENT:                                                                                                                              DATE:  10/27/2022 LSVT:  Patient was seen for LSVT Daily Session Maximal Daily Exercises for facilitation,  and coordination of movement.   Maximum Sustained Movements are designed to rescale the amplitude of movement output for generalization to daily functional activities.Pt. Performed as follows for 1 set of 10 repetitions each multi-directional sustained movements:  1) Floor to ceiling  2) Side to side multidirectional   Multi-directional repetitive movements performed in standing and are designed to provide retraining effort needed for sustained muscle activation in tasks. Performed as follows for 1 set of 10 repetitions each of multi-directional repetitive movements:  3) Step and reach forward step 4) Step and reach sideways step   5) Step and reach backwards step 6) Rock and reach forward/backward  7) Rock and reach sideways   Identified Functional Component Tasks include:  Sit to stand BIG functional component task with supervision 5 reps  2. BIG reciprocal arm swing 3. Extending right shoulder to reach for items behind him. 4. Stepping over an obstacle 5. Writing   Pt. worked on Improving the amplitude of bilateral arm swing holding bilateral 3# hand weight, alternating arm swing with the end point to the wall as a target for feedback x3 sets 10 reps each.  OT held hands up for a target to increase amplitude on the forward arm swing, while pt used the wall as a tactile cue to maximize amplitude of posterior arm swing.  Practiced increased trunk rotation with rock and reach sideways, using sticky notes on wall for hand targets when reaching behind him.  Completed 3 sets 10 reps each direction, requiring vc for form and technique.  Pt often  touched 1 hand at a time to each target d/t decreased balance with the increased trunk rotation, but balance improved with repetition.   BIG ambulation:   Pt completed standing marches with tapping foot to tops of cone with each march x3 sets 10 reps each. Further facilitated BIG ambulation with stepping over a line of 7 cones, min vc to step over and not around cones.  Added big arm swing with each step over cone, min vc for maintaining opposite arm with opposite leg forward instead of same arm/same leg.    Pt worked on Building surveyor standing on Bosu ball; initial min guard but able to progress to close supv to maintain balance.  PATIENT EDUCATION: Education details:  Sustained, and multi-directional  Maximal Daily exercises.  Person educated: Patient Education method: Customer service manager Education comprehension: verbalized understanding, returned demonstration, and needs further education  HOME EXERCISE PROGRAM:  GOALS: Goals reviewed with patient? Yes   SHORT TERM GOALS: Target date: 10/19/2022     Pt. Will independently demonstrate the proper techniques for the Maximal Daily Exercise HEP. Baseline: Pt. Currently does not have; min vc for form, technique, and dual tasking with counting  Goal status: ongoing     LONG TERM GOALS: Target date: 11/09/2022     Pt. Will increase the FOTO score by 2 points to reflect Pt. perceived improvement with assessment specific ADL, and IADLs. Baseline: FOTO: 92; 10/19/22: 96.7 Goal status:    2.  Pt. Will initiate, and sustain bilateral reciprocal arm swing 100% of the time during the 6 min. Walk test to improve posture, and gait performance. Baseline: Eval: Pt. Presents with limited arm swing  during gait; 10/19/22: Pt requires min-mod vc to sustain reciprocal arm swing and erect posture  Goal status: ongoing    3.  Pt. Will assume fully upright midline standing posture 100% of the time for all reps performed, and timed on the 5  times  sit to stand test. Baseline: Eval: Forward trunk, head and neck posture, the 5th rep Pt. Did not assume full upright midline posture (half stand); 10/19/22: pt performs with initial min vc for technique Goal status: ongoing   4.  Pt. Will improve writing skills in order to be able to independently, legibly, and efficiently fill out checks while under pressure, and multitasking.  Baseline: Eval: Pt. Is able to write his name with 75% legibility. Pt. Reports legibility changes, and tremors when writing out checks while under pressure; 10/19/22: pt reports legibility declines when fatigued, but using finger flicks and taking a break is helpful.  Goal status: ongoing ASSESSMENT:  CLINICAL IMPRESSION:  Pt continues to present with improved amplitude of arm movements during the Multidirectional Maximal Daily Exercises and maintained big amplitude with each exercise this date without cueing.  Pt was able to maintain form with conversation during the MDE, but required intermittent cues for maintaining count. Pt. performs Hand/finger flicks for floor to ceiling MDE.  Pt. requiring fewer cues to increase the amplitude of right arm swing during BIG walking, and upright posture. Writing legibility good today with no loss of balance on the Bosu board. Initial min guard only when mounting the Bosu board, but quickly progressed to supv without LOB for duration of writing activity.  Pt requires intermittent cues to normalize arm swing with opposite arm/opposite leg forward when stepping over cones and weaving around gym, as pt typically starts out with same arm/same leg.  OT reviewed concepts of LSVT with goal for pt to continue to carryover exercises noted above to maximize amplitude of movements and maintain erect posture with mobility and ADLs.  Pt verbalized understanding.  Planning 1 additional visit for discharge assessment.  Pt continues to benefit from OT services for LSVT to improve the amplitude of  movements, improve posture, and promote safe gait, and overall function during ADLs, and IADL tasks.     PERFORMANCE DEFICITS: in functional skills including ADLs, IADLs, coordination, dexterity, ROM, strength, pain, Fine motor control, Gross motor control, body mechanics, endurance, decreased knowledge of use of DME, and UE functional use, cognitive skills, and psychosocial skills including coping strategies, environmental adaptation, habits, and routines and behaviors.   IMPAIRMENTS: are limiting patient from ADLs, IADLs, and leisure.   CO-MORBIDITIES; may have co-morbidities  that affects occupational performance. Patient will benefit from skilled OT to address above impairments and improve overall function.  MODIFICATION OR ASSISTANCE TO COMPLETE EVALUATION: Min-Moderate modification of tasks or assist with assess necessary to complete an evaluation.  OT OCCUPATIONAL PROFILE AND HISTORY: Detailed assessment: Review of records and additional review of physical, cognitive, psychosocial history related to current functional performance.  CLINICAL DECISION MAKING: Moderate - several treatment options, min-mod task modification necessary  REHAB POTENTIAL: Good  EVALUATION COMPLEXITY: Moderate    PLAN:  OT FREQUENCY: 4x/week  OT DURATION: 6 weeks  PLANNED INTERVENTIONS: self care/ADL training, therapeutic exercise, therapeutic activity, neuromuscular re-education, manual therapy, passive range of motion, and paraffin, balance training  RECOMMENDED OTHER SERVICES: N/A  CONSULTED AND AGREED WITH PLAN OF CARE: Patient  PLAN FOR NEXT SESSION:  Continues to work on form and technique with the Maximal Daily exercises.   Leta Speller, MS, OTR/L   Darleene Cleaver, OT 10/29/2022, 10:06 PM

## 2022-10-29 NOTE — Therapy (Addendum)
OUTPATIENT OCCUPATIONAL THERAPY NEURO TREATMENT  Patient Name: Robert Fuller MRN: [id number] DOB:11/10/1951, 71 y.o., male Today's Date: 10/29/2022  PCP: Dr. Miguel Aschoff, MD REFERRING PROVIDER: Dr. Jennings Books, MD   OT End of Session - 10/29/22 2127     Visit Number 15    Number of Visits 17    Date for OT Re-Evaluation 11/09/22    Authorization Time Period Progress reporting period starting 09/28/2022    OT Start Time 1515    OT Stop Time 1615    OT Time Calculation (min) 60 min    Activity Tolerance Patient tolerated treatment well    Behavior During Therapy [facility] for tasks assessed/performed              OT End of Session - 10/29/22 2127     Visit Number 15    Number of Visits 17    Date for OT Re-Evaluation 11/09/22    Authorization Time Period Progress reporting period starting 09/28/2022    OT Start Time 1515    OT Stop Time 1615    OT Time Calculation (min) 60 min    Activity Tolerance Patient tolerated treatment well    Behavior During Therapy [facility] for tasks assessed/performed             Past Medical History:  Diagnosis Date   Barrett's esophagus    Cough    Esophageal reflux    Kidney stone    Past Surgical History:  Procedure Laterality Date   COLONOSCOPY WITH PROPOFOL N/A 07/21/2022   Procedure: COLONOSCOPY WITH PROPOFOL;  Surgeon: [name], MD;  Location: [facility] ENDOSCOPY;  Service: Endoscopy;  Laterality: N/A;   ESOPHAGOGASTRODUODENOSCOPY (EGD) WITH PROPOFOL N/A 04/06/2016   Procedure: ESOPHAGOGASTRODUODENOSCOPY (EGD) WITH PROPOFOL;  Surgeon: Hulen Luster, MD;  Location: [facility] ENDOSCOPY;  Service: Gastroenterology;  Laterality: N/A;   ESOPHAGOGASTRODUODENOSCOPY (EGD) WITH PROPOFOL N/A 07/21/2022   Procedure: ESOPHAGOGASTRODUODENOSCOPY (EGD) WITH PROPOFOL;  Surgeon: [name], MD;  Location: [facility] ENDOSCOPY;  Service: Endoscopy;  Laterality: N/A;   NO PAST SURGERIES     UPPER GI ENDOSCOPY  04/06/16   normal other than esophageal  changes present consistent with barretts esophagus, repeat 2019   Patient Active Problem List   Diagnosis Date Noted   ED (erectile dysfunction) of organic origin 08/04/2016   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  SUBJECTIVE STATEMENT:  Pt. reports that he just found out he has Parkinson's disease at his last MD visit a couple of weeks ago. Pt accompanied by: self  PERTINENT HISTORY:   Pt. is a 71 y.o. male who was recently diagnosed with Parkinson's Disease with bilateral hand tremors on 09/14/2022. Pt. has bilateral Carpal Tunnel Syndrome with numbness in digits of his bilateral hands.   PRECAUTIONS: None  WEIGHT BEARING RESTRICTIONS: No  PAIN:  Are you having pain? No  FALLS: Has patient fallen in last 6 months? No  LIVING ENVIRONMENT: Lives with: lives with their spouse Lives in: House/apartment Stairs: Yes: Internal: 3 floors steps; on right going up and on  left going up 3 stories Has following equipment at home: Grab bars  PLOF: Independent  PATIENT GOALS: To improve amplitude of movement  OBJECTIVE:   HAND DOMINANCE: Right  ADLs: Overall ADLs:  Independent Transfers/ambulation related to ADLs: Eating: Independent Grooming:  Independent UB Dressing: Independent LB Dressing: Independent Toileting: Independent Bathing: Independent Tub Shower transfers:  Independent no grab bars Equipment: Grab bars  IADLs: Shopping: Independent Light housekeeping:  Independent Meal Prep:  Independent Community  mobility: driving Medication management:  Tourist information centre manager: No change Handwriting: 75% legible: Pt. reports decreased legibility when multitasking   MOBILITY STATUS: Independent  POSTURE COMMENTS:  Pectoral tightness with rounded shoulders Sitting balance:  I Unsupported static, and dynamic: good  ACTIVITY TOLERANCE: Activity tolerance: WFL  FUNCTIONAL OUTCOME MEASURES:  FOTO: 92 Timed Up, and Go  (TUG) Score: 8 sec.; 10/19/22: 8 sec 5 Times sit to stand Score: 12 sec. (Pt. did not come into a full stand for the last rep. 2/2 foot placement); 10/19/22: 13 sec (came to full stand) 6-Minute Walk Test Score: 1875 ft.; 10/19/22: 2,147 ft Berg Balance Scale Score: 56; 10/19/22: 56 Freezing of Gait Questionnaire Score: 0; 10/19/22: 0   Bilateral  Hand Tremors with the left greater than right. Pt. Reports tremors are worse with dual or multitasking, also when under pressure.   UPPER EXTREMITY ROM:    Active ROM Right Eval WFL Left Eval [facility]  Shoulder flexion    Shoulder abduction    Shoulder adduction    Shoulder extension    Shoulder internal rotation    Shoulder external rotation    Elbow flexion    Elbow extension    Wrist flexion    Wrist extension    Wrist ulnar deviation    Wrist radial deviation    Wrist pronation    Wrist supination    (Blank rows = not tested)  UPPER EXTREMITY MMT:     MMT Right eval Left eval  Shoulder flexion 5/5 5/5  Shoulder abduction 5/5 5/5  Shoulder adduction    Shoulder extension    Shoulder internal rotation    Shoulder external rotation    Middle trapezius    Lower trapezius    Elbow flexion 5/5 5/5  Elbow extension 5/5 5/5  Wrist flexion    Wrist extension 5/5 5/5  Wrist ulnar deviation    Wrist radial deviation    Wrist pronation    Wrist supination    (Blank rows = not tested)  HAND FUNCTION: Grip strength: Right: 112 lbs; Left: 110 lbs, Lateral pinch: Right: 30 lbs, Left: 26 lbs, and 3 point  pinch: Right: 23 lbs, Left: 23 lbs 10/19/22: R grip 94, L 97 (reports hand fatigue from mowing lawn and blowing leaves today) , R lateral 28; L 27, 3 point pinch, 19, L 22  COORDINATION: 9 Hole Peg test: Right: 26 sec; Left: 24 sec  with Left hand tremors noted  10th visit: R : 29 sec; L 33    SENSATION: Numbness in the bilateral 2nd, 3rd, and 4th digits  EDEMA: None  COGNITION: Overall cognitive status: Within functional limits for tasks assessed  VISION:  [facility] for tasks assessed  PERCEPTION: WFL  PRAXIS: WFL   TODAY'S TREATMENT:  DATE:    LSVT:  Patient was seen for LSVT Daily Session Maximal Daily Exercises for facilitation, and coordination of movement.   Maximum Sustained Movements are designed to rescale the amplitude of movement output for generalization to daily functional activities.Pt. Performed as follows for 1 set of 10 repetitions each multi-directional sustained movements:  1) Floor to ceiling  2) Side to side multidirectional   Multi-directional repetitive movements performed in standing and are designed to provide retraining effort needed for sustained muscle activation in tasks. Performed as follows for 1 set of 10 repetitions each of multi-directional repetitive movements:  3) Step and reach forward step 4) Step and reach sideways step   5) Step and reach backwards step 6) Rock and reach forward/backward  7) Rock and reach sideways   Identified Functional Component Tasks include:  Sit to stand BIG functional component task with supervision 5 reps  2. BIG reciprocal arm swing 3. Extending right shoulder to reach for items behind him. 4. Stepping over an obstacle 5. Writing   Pt. worked on Improving the amplitude of bilateral arm swing holding bilateral 3# hand weight, alternating arm swing with the end point to the wall as a  target for feedback x3 sets 10 reps each.  OT held hands up for a target to increase amplitude on the forward arm swing, while pt used the wall as a tactile cue to maximize amplitude of posterior arm swing.  BIG ambulation:   Practiced maintaining big arm swing and erect posture on uneven surfaces outside, including pavement, grass, and pine straw.  Pt requires intermittent cues to normalize arm swing with opposite arm/opposite leg forward, as pt typically starts out with same arm/same leg.  Erect posture on uneven surfaces maintained well.    Pt completed standing marches with tapping foot to tops of cone with each march x3 sets 10 reps each. Further facilitated BIG ambulation with stepping over a line of 7 cones, min vc to step over and not around cones.  Added big arm swing with each step over cone, mod vc for maintaining opposite arm with opposite leg forward instead of same arm/same leg.   Pt worked on Building surveyor standing on Bosu ball; initial min guard but able to progress to close supv to maintain balance.       PATIENT EDUCATION: Education details:  Sustained, and multi-directional  Maximal Daily exercises.  Person educated: Patient Education method: Customer service manager Education comprehension: verbalized understanding, returned demonstration, and needs further education  HOME EXERCISE PROGRAM:  GOALS: Goals reviewed with patient? Yes   SHORT TERM GOALS: Target date: 10/19/2022     Pt. Will independently demonstrate the proper techniques for the Maximal Daily Exercise HEP. Baseline: Pt. Currently does not have; min vc for form, technique, and dual tasking with counting  Goal status: ongoing     LONG TERM GOALS: Target date: 11/09/2022     Pt. Will increase the FOTO score by 2 points to reflect Pt. perceived improvement with assessment specific ADL, and IADLs. Baseline: FOTO: 92; 10/19/22: 96.7 Goal status:    2.  Pt. Will initiate, and sustain bilateral  reciprocal arm swing 100% of the time during the 6 min. Walk test to improve posture, and gait performance. Baseline: Eval: Pt. Presents with limited arm swing  during gait; 10/19/22: Pt requires min-mod vc to sustain reciprocal arm swing and erect posture  Goal status: ongoing    3.  Pt. Will assume fully upright midline standing posture 100% of  the time for all reps performed, and timed on the 5 times sit to stand test. Baseline: Eval: Forward trunk, head and neck posture, the 5th rep Pt. Did not assume full upright midline posture (half stand); 10/19/22: pt performs with initial min vc for technique Goal status: ongoing   4.  Pt. Will improve writing skills in order to be able to independently, legibly, and efficiently fill out checks while under pressure, and multitasking.  Baseline: Eval: Pt. Is able to write his name with 75% legibility. Pt. Reports legibility changes, and tremors when writing out checks while under pressure; 10/19/22: pt reports legibility declines when fatigued, but using finger flicks and taking a break is helpful.  Goal status: ongoing ASSESSMENT:  CLINICAL IMPRESSION:  Pt continues to present with improved amplitude of arm movements during the Multidirectional Maximal Daily Exercises requiring fewer cues today.  Pt is maintaining form well with alternating directions during forward/backward/side stepping and reaching exercises. Pt requires less cues for proper form when dual tasking with counting, and talking during the floor to ceiling MDE. Pt. performs Hand/finger flicks for floor to ceiling MDE.  Pt. requiring fewer cues to increase the amplitude of right arm swing during BIG walking, and upright posture. Writing legibility and figure eights were more legible today with no loss of balance on the Bosu board.  Pt beginning with min guard on Bosu board but able to progress to close supv for balance. Practiced maintaining big arm swing and erect posture on uneven surfaces  outside, including pavement, grass, and pine straw.  Pt requires intermittent cues to normalize arm swing with opposite arm/opposite leg forward, as pt typically starts out with same arm/same leg.  Erect posture on uneven surfaces maintained well.  Pt. continues to benefit from OT services for LSVT to improve the amplitude of movements, improve posture, and promote safe gait, and overall function during ADLs, and IADL tasks.   PERFORMANCE DEFICITS: in functional skills including ADLs, IADLs, coordination, dexterity, ROM, strength, pain, Fine motor control, Gross motor control, body mechanics, endurance, decreased knowledge of use of DME, and UE functional use, cognitive skills, and psychosocial skills including coping strategies, environmental adaptation, habits, and routines and behaviors.   IMPAIRMENTS: are limiting patient from ADLs, IADLs, and leisure.   CO-MORBIDITIES; may have co-morbidities  that affects occupational performance. Patient will benefit from skilled OT to address above impairments and improve overall function.  MODIFICATION OR ASSISTANCE TO COMPLETE EVALUATION: Min-Moderate modification of tasks or assist with assess necessary to complete an evaluation.  OT OCCUPATIONAL PROFILE AND HISTORY: Detailed assessment: Review of records and additional review of physical, cognitive, psychosocial history related to current functional performance.  CLINICAL DECISION MAKING: Moderate - several treatment options, min-mod task modification necessary  REHAB POTENTIAL: Good  EVALUATION COMPLEXITY: Moderate    PLAN:  OT FREQUENCY: 4x/week  OT DURATION: 6 weeks  PLANNED INTERVENTIONS: self care/ADL training, therapeutic exercise, therapeutic activity, neuromuscular re-education, manual therapy, passive range of motion, and paraffin, balance training  RECOMMENDED OTHER SERVICES: N/A  CONSULTED AND AGREED WITH PLAN OF CARE: Patient  PLAN FOR NEXT SESSION:  Continues to work on form  and technique with the Maximal Daily exercises.   Leta Speller, MS, OTR/L   Darleene Cleaver, OT 10/29/2022, 9:28 PM

## 2022-11-02 ENCOUNTER — Ambulatory Visit: Payer: Medicare HMO | Attending: Neurology

## 2022-11-02 DIAGNOSIS — R278 Other lack of coordination: Secondary | ICD-10-CM | POA: Insufficient documentation

## 2022-11-02 DIAGNOSIS — G20B2 Parkinson's disease with dyskinesia, with fluctuations: Secondary | ICD-10-CM | POA: Diagnosis not present

## 2022-11-02 NOTE — Therapy (Signed)
OUTPATIENT OCCUPATIONAL THERAPY NEURO-LSVT BIG DISCHARGE  Patient Name: Robert Fuller MRN: [id number] DOB:[birth year], 71 y.o., male Today's Date: 11/02/2022  PCP: Dr. Miguel Aschoff, MD REFERRING PROVIDER: Dr. Jennings Books, MD    OT End of Session - 11/04/22 0910     Visit Number 17    Number of Visits 17    Date for OT Re-Evaluation 11/09/22    Authorization Time Period Progress reporting period starting 09/28/2022    OT Start Time 1545    OT Stop Time 1640    OT Time Calculation (min) 55 min    Activity Tolerance Patient tolerated treatment well    Behavior During Therapy [facility] for tasks assessed/performed               Past Medical History:  Diagnosis Date   Barrett's esophagus    Cough    Esophageal reflux    Kidney stone    Past Surgical History:  Procedure Laterality Date   COLONOSCOPY WITH PROPOFOL N/A 07/21/2022   Procedure: COLONOSCOPY WITH PROPOFOL;  Surgeon: [name], MD;  Location: [facility] ENDOSCOPY;  Service: Endoscopy;  Laterality: N/A;   ESOPHAGOGASTRODUODENOSCOPY (EGD) WITH PROPOFOL N/A 04/06/2016   Procedure: ESOPHAGOGASTRODUODENOSCOPY (EGD) WITH PROPOFOL;  Surgeon: Hulen Luster, MD;  Location: [facility] ENDOSCOPY;  Service: Gastroenterology;  Laterality: N/A;   ESOPHAGOGASTRODUODENOSCOPY (EGD) WITH PROPOFOL N/A 07/21/2022   Procedure: ESOPHAGOGASTRODUODENOSCOPY (EGD) WITH PROPOFOL;  Surgeon: [name], MD;  Location: [facility] ENDOSCOPY;  Service: Endoscopy;  Laterality: N/A;   NO PAST SURGERIES     UPPER GI ENDOSCOPY  04/06/16   normal other than esophageal changes present consistent with barretts esophagus, repeat 2019   Patient Active Problem List   Diagnosis Date Noted   ED (erectile dysfunction) of organic origin 08/04/2016   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   SUBJECTIVE STATEMENT: Pt. reports that he just found out he has Parkinson's disease at his last MD visit a couple of weeks ago. Pt accompanied by: self  PERTINENT HISTORY:   Pt. is a 71 y.o. male who was recently diagnosed with Parkinson's Disease with bilateral hand tremors on 09/14/2022. Pt. has bilateral Carpal Tunnel Syndrome with numbness in digits of his bilateral hands.   PRECAUTIONS: None  WEIGHT BEARING RESTRICTIONS: No  PAIN:  Are you having pain? No  FALLS: Has patient fallen in last 6 months? No  LIVING ENVIRONMENT: Lives with: lives with their spouse Lives in: House/apartment Stairs: Yes: Internal: 3 floors steps; on right going up and on left going up 3 stories Has following equipment at home: Grab bars  PLOF: Independent  PATIENT GOALS: To improve amplitude of movement  OBJECTIVE:   HAND DOMINANCE: Right  ADLs: Overall ADLs:  Independent Transfers/ambulation related to ADLs: Eating: Independent Grooming:  Independent UB Dressing: Independent LB Dressing: Independent Toileting: Independent Bathing: Independent Tub Shower transfers:  Independent no grab bars Equipment: Grab bars  IADLs: Shopping: Independent Light housekeeping:  Independent Meal Prep:  Independent Community mobility: driving Medication management:  Independent Financial management: No change Handwriting: 75% legible: Pt. reports  decreased legibility when multitasking   MOBILITY STATUS: Independent  POSTURE COMMENTS:  Pectoral tightness with rounded shoulders Sitting balance:  I Unsupported static, and dynamic: good  ACTIVITY TOLERANCE: Activity tolerance: WFL  FUNCTIONAL OUTCOME MEASURES:  FOTO:  92; 11/02/22: 96.7 Timed Up, and Go  (TUG) Score: 8 sec.; 10/19/22: 8 sec 5 Times sit to stand Score: 12 sec. (Pt. did not come into a full stand for the last rep. 2/2 foot placement); 10/19/22: 13 sec (came to full stand);  11/02/22: 13 sec full stance with each rep 6-Minute Walk Test Score: 1875 ft.; 10/19/22: 2,147 ft; 11/02/22: 1,740 ft (but performed with erect posture and bilat arm swing throughout) Berg Balance Scale Score: 56; 10/19/22: 56; 11/02/22: 56 Freezing of Gait Questionnaire Score: 0; 10/19/22: 0; 11/02/22: 0   Bilateral  Hand Tremors with the left greater than right. Pt. Reports tremors are worse with dual or multitasking, also when under pressure.   UPPER EXTREMITY ROM:    Active ROM Right Eval WFL Left Eval [facility]  Shoulder flexion    Shoulder abduction    Shoulder adduction    Shoulder extension    Shoulder internal rotation    Shoulder external rotation    Elbow flexion    Elbow extension    Wrist flexion    Wrist extension    Wrist ulnar deviation    Wrist radial deviation    Wrist pronation    Wrist supination    (Blank rows = not tested)  UPPER EXTREMITY MMT:     MMT Right eval Left eval  Shoulder flexion 5/5 5/5  Shoulder abduction 5/5 5/5  Shoulder adduction    Shoulder extension    Shoulder internal rotation    Shoulder external rotation    Middle trapezius    Lower trapezius    Elbow flexion 5/5 5/5  Elbow extension 5/5 5/5  Wrist flexion    Wrist extension 5/5 5/5  Wrist ulnar deviation    Wrist radial deviation    Wrist pronation    Wrist supination    (Blank rows = not tested)  HAND FUNCTION: Grip strength: Right: 112 lbs; Left: 110 lbs, Lateral pinch: Right: 30 lbs, Left: 26 lbs, and 3 point pinch: Right: 23 lbs, Left: 23 lbs 10/19/22: R grip 94, L 97 (reports hand fatigue from mowing lawn and blowing leaves today) , R lateral 28; L 27, 3 point pinch, 19, L 22   COORDINATION: 9 Hole Peg test: Right: 26 sec; Left: 24 sec  with Left  hand tremors noted  10th visit: R : 29 sec; L 33   SENSATION: Numbness in the bilateral 2nd, 3rd, and 4th digits  EDEMA: None  COGNITION: Overall cognitive status: Within functional limits for tasks assessed  VISION:  [facility] for tasks assessed  PERCEPTION: WFL  PRAXIS: WFL   TODAY'S TREATMENT:  DATE:  11/02/22 LSVT:  Patient was seen for LSVT Daily Session Maximal Daily Exercises for facilitation, and coordination of movement.   Maximum Sustained Movements are designed to rescale the amplitude of movement output for generalization to daily functional activities.Pt. Performed as follows for 1 set of 10 repetitions each multi-directional sustained movements:  1) Floor to ceiling  2) Side to side multidirectional   Multi-directional repetitive movements performed in standing and are designed to provide retraining effort needed for sustained muscle activation in tasks. Performed as follows for 1 set of 10 repetitions each of multi-directional repetitive movements:  3) Step and reach forward step 4) Step and reach sideways step   5) Step and reach backwards step 6) Rock and reach forward/backward  7) Rock and reach sideways   Identified Functional Component Tasks include:  Sit to stand BIG functional component task with supervision 5 reps  2. BIG reciprocal arm swing 3. Extending right shoulder to reach for items behind him. 4. Stepping over an obstacle 5. Writing   Objective measures taken and goals updated for discharge.  BIG ambulation:  6 min walk test, see above for ft.  Pt was provided with initial instruction for goal to maintain erect posture and bilateral arm swing throughout, which he was able to maintain.  Despite fewer steps as compared to last 6 MWT, form was better and maintained throughout.   PATIENT EDUCATION: Education details:   Discharge instructions, ongoing carryover tasks Person educated: Patient Education method: Customer service manager Education comprehension: verbalized understanding  HOME EXERCISE PROGRAM:  GOALS: Goals reviewed with patient? Yes   SHORT TERM GOALS: Target date: 10/19/2022     Pt. Will independently demonstrate the proper techniques for the Maximal Daily Exercise HEP. Baseline: Pt. Currently does not have; min vc for form, technique, and dual tasking with counting; 11/02/22: indep with written handouts Goal status: achieved   LONG TERM GOALS: Target date: 11/09/2022     Pt. Will increase the FOTO score by 2 points to reflect Pt. perceived improvement with assessment specific ADL, and IADLs. Baseline: FOTO: 92; 10/19/22: 96.7; 11/02/22: 96.7 Goal status: achieved   2.  Pt. Will initiate, and sustain bilateral reciprocal arm swing 100% of the time during the 6 min. Walk test to improve posture, and gait performance. Baseline: Eval: Pt. Presents with limited arm swing  during gait; 10/19/22: Pt requires min-mod vc to sustain reciprocal arm swing and erect posture; 11/02/22: able to sustain good posture and bilat arm swing throughout after initial cue before beginning test. Goal status: achieved   3.  Pt. Will assume fully upright midline standing posture 100% of the time for all reps performed, and timed on the 5 times sit to stand test. Baseline: Eval: Forward trunk, head and neck posture, the 5th rep Pt. Did not assume full upright midline posture (half stand); 10/19/22: pt performs with initial min vc for technique; 11/02/22: upright midline standing posture 100% of the time for 10 reps Goal status: achieved   4.  Pt. Will improve writing skills in order to be able to independently, legibly, and efficiently fill out checks while under pressure, and multitasking.  Baseline: Eval: Pt. Is able to write his name with 75% legibility. Pt. Reports legibility changes, and tremors when  writing out checks while under pressure; 10/19/22: pt reports legibility declines when fatigued, but using finger flicks and taking a break is helpful; 11/02/22: Pt reports he now uses finger flicks as needed and takes rest breaks as needed to "  reset," and minimizes distraction in his environment to maximize legibility. Goal status: achieved  ASSESSMENT:  CLINICAL IMPRESSION:  Pt seen today for discharge assessment.  Pt has consistently demonstrated good tolerance to all maximal daily exercises.  Pt demonstrates optimal form with big arm and big leg movements with MDE, and demos good posture throughout.  Pt demos improved trunk rotation and balance during sideways rock and reach, demonstrating ability to look over shoulder on each side R/L to look behind him with good weight shift.  Pt reports overall, he and spouse have noticed a strong improvement in pt's posture.  Pt was able to maintain erect posture and bilat reciprocal arm swing throughout 6 min walk test.  FOTO score improved from 92 to 96.7, and Berg is 56/56 (maintained through eval to discharge).  Pt is indep with writing strategies to optimize legibility under pressure with handwriting.  Pt is indep with maximal daily exercises with written handouts, and understands recommendation for ongoing participation in these to maintain amplitude of movements, maintain good posture, to promote safe gait, and maintain overall function during ADLs and IADL tasks.  Pt has met all OT goals and is appropriate for discharge this visit.     PERFORMANCE DEFICITS: in functional skills including ADLs, IADLs, coordination, dexterity, ROM, strength, pain, Fine motor control, Gross motor control, body mechanics, endurance, decreased knowledge of use of DME, and UE functional use, cognitive skills, and psychosocial skills including coping strategies, environmental adaptation, habits, and routines and behaviors.   IMPAIRMENTS: are limiting patient from ADLs, IADLs, and  leisure.   CO-MORBIDITIES; may have co-morbidities  that affects occupational performance. Patient will benefit from skilled OT to address above impairments and improve overall function.  MODIFICATION OR ASSISTANCE TO COMPLETE EVALUATION: Min-Moderate modification of tasks or assist with assess necessary to complete an evaluation.  OT OCCUPATIONAL PROFILE AND HISTORY: Detailed assessment: Review of records and additional review of physical, cognitive, psychosocial history related to current functional performance.  CLINICAL DECISION MAKING: Moderate - several treatment options, min-mod task modification necessary  REHAB POTENTIAL: Good  EVALUATION COMPLEXITY: Moderate    PLAN:  OT FREQUENCY: 4x/week  OT DURATION: 6 weeks  PLANNED INTERVENTIONS: self care/ADL training, therapeutic exercise, therapeutic activity, neuromuscular re-education, manual therapy, passive range of motion, and paraffin, balance training  RECOMMENDED OTHER SERVICES: N/A  CONSULTED AND AGREED WITH PLAN OF CARE: Patient  PLAN FOR NEXT SESSION:  Continues to work on form and technique with the Maximal Daily exercises.   Leta Speller, MS, OTR/L   Darleene Cleaver, OT 11/02/2022, 4:12 PM

## 2022-11-03 ENCOUNTER — Encounter: Payer: Medicare HMO | Admitting: Occupational Therapy

## 2022-11-05 ENCOUNTER — Encounter: Payer: Medicare HMO | Admitting: Occupational Therapy

## 2023-01-15 DIAGNOSIS — G20A1 Parkinson's disease without dyskinesia, without mention of fluctuations: Secondary | ICD-10-CM | POA: Diagnosis not present

## 2023-01-15 DIAGNOSIS — G5603 Carpal tunnel syndrome, bilateral upper limbs: Secondary | ICD-10-CM | POA: Diagnosis not present

## 2023-01-15 DIAGNOSIS — G20A2 Parkinson's disease without dyskinesia, with fluctuations: Secondary | ICD-10-CM | POA: Diagnosis not present

## 2023-01-15 DIAGNOSIS — R0683 Snoring: Secondary | ICD-10-CM | POA: Diagnosis not present

## 2023-01-15 DIAGNOSIS — G4752 REM sleep behavior disorder: Secondary | ICD-10-CM | POA: Diagnosis not present

## 2023-03-31 DIAGNOSIS — G20A1 Parkinson's disease without dyskinesia, without mention of fluctuations: Secondary | ICD-10-CM | POA: Diagnosis not present

## 2023-03-31 DIAGNOSIS — R7303 Prediabetes: Secondary | ICD-10-CM | POA: Diagnosis not present

## 2023-03-31 DIAGNOSIS — G5603 Carpal tunnel syndrome, bilateral upper limbs: Secondary | ICD-10-CM | POA: Diagnosis not present

## 2023-04-22 DIAGNOSIS — D2272 Melanocytic nevi of left lower limb, including hip: Secondary | ICD-10-CM | POA: Diagnosis not present

## 2023-04-22 DIAGNOSIS — D225 Melanocytic nevi of trunk: Secondary | ICD-10-CM | POA: Diagnosis not present

## 2023-04-22 DIAGNOSIS — D2262 Melanocytic nevi of left upper limb, including shoulder: Secondary | ICD-10-CM | POA: Diagnosis not present

## 2023-04-22 DIAGNOSIS — D2261 Melanocytic nevi of right upper limb, including shoulder: Secondary | ICD-10-CM | POA: Diagnosis not present

## 2023-04-22 DIAGNOSIS — L821 Other seborrheic keratosis: Secondary | ICD-10-CM | POA: Diagnosis not present

## 2023-04-22 DIAGNOSIS — D2271 Melanocytic nevi of right lower limb, including hip: Secondary | ICD-10-CM | POA: Diagnosis not present

## 2023-06-09 DIAGNOSIS — G20A1 Parkinson's disease without dyskinesia, without mention of fluctuations: Secondary | ICD-10-CM | POA: Diagnosis not present

## 2023-06-09 DIAGNOSIS — G5603 Carpal tunnel syndrome, bilateral upper limbs: Secondary | ICD-10-CM | POA: Diagnosis not present

## 2023-06-30 ENCOUNTER — Telehealth: Payer: Self-pay

## 2023-06-30 NOTE — Telephone Encounter (Signed)
LVM for patient to call back [phone], or to call PCP office to schedule follow up apt. AS, CMA  

## 2023-07-16 DIAGNOSIS — Z87442 Personal history of urinary calculi: Secondary | ICD-10-CM | POA: Diagnosis not present

## 2023-07-16 DIAGNOSIS — Z0000 Encounter for general adult medical examination without abnormal findings: Secondary | ICD-10-CM | POA: Diagnosis not present

## 2023-07-16 DIAGNOSIS — R252 Cramp and spasm: Secondary | ICD-10-CM | POA: Diagnosis not present

## 2023-07-16 DIAGNOSIS — R7303 Prediabetes: Secondary | ICD-10-CM | POA: Diagnosis not present

## 2023-07-16 DIAGNOSIS — G20A1 Parkinson's disease without dyskinesia, without mention of fluctuations: Secondary | ICD-10-CM | POA: Diagnosis not present

## 2023-07-16 DIAGNOSIS — K2270 Barrett's esophagus without dysplasia: Secondary | ICD-10-CM | POA: Diagnosis not present

## 2023-07-21 DIAGNOSIS — G20A1 Parkinson's disease without dyskinesia, without mention of fluctuations: Secondary | ICD-10-CM | POA: Diagnosis not present

## 2023-08-03 DIAGNOSIS — H2513 Age-related nuclear cataract, bilateral: Secondary | ICD-10-CM | POA: Diagnosis not present

## 2023-08-03 DIAGNOSIS — H353131 Nonexudative age-related macular degeneration, bilateral, early dry stage: Secondary | ICD-10-CM | POA: Diagnosis not present

## 2023-08-11 DIAGNOSIS — G5603 Carpal tunnel syndrome, bilateral upper limbs: Secondary | ICD-10-CM | POA: Diagnosis not present

## 2023-08-27 DIAGNOSIS — L218 Other seborrheic dermatitis: Secondary | ICD-10-CM | POA: Diagnosis not present

## 2023-11-29 DIAGNOSIS — G5603 Carpal tunnel syndrome, bilateral upper limbs: Secondary | ICD-10-CM | POA: Diagnosis not present

## 2023-11-29 DIAGNOSIS — G20A1 Parkinson's disease without dyskinesia, without mention of fluctuations: Secondary | ICD-10-CM | POA: Diagnosis not present

## 2023-12-02 DIAGNOSIS — H2513 Age-related nuclear cataract, bilateral: Secondary | ICD-10-CM | POA: Diagnosis not present

## 2023-12-22 DIAGNOSIS — M9904 Segmental and somatic dysfunction of sacral region: Secondary | ICD-10-CM | POA: Diagnosis not present

## 2023-12-22 DIAGNOSIS — M9903 Segmental and somatic dysfunction of lumbar region: Secondary | ICD-10-CM | POA: Diagnosis not present

## 2023-12-22 DIAGNOSIS — M5451 Vertebrogenic low back pain: Secondary | ICD-10-CM | POA: Diagnosis not present

## 2023-12-22 DIAGNOSIS — M7918 Myalgia, other site: Secondary | ICD-10-CM | POA: Diagnosis not present

## 2024-02-14 DIAGNOSIS — R413 Other amnesia: Secondary | ICD-10-CM | POA: Diagnosis not present

## 2024-02-14 DIAGNOSIS — G20A2 Parkinson's disease without dyskinesia, with fluctuations: Secondary | ICD-10-CM | POA: Diagnosis not present

## 2024-02-14 DIAGNOSIS — G4752 REM sleep behavior disorder: Secondary | ICD-10-CM | POA: Diagnosis not present

## 2024-02-16 DIAGNOSIS — H25811 Combined forms of age-related cataract, right eye: Secondary | ICD-10-CM | POA: Diagnosis not present

## 2024-03-01 DIAGNOSIS — H25812 Combined forms of age-related cataract, left eye: Secondary | ICD-10-CM | POA: Diagnosis not present

## 2024-03-01 DIAGNOSIS — Z9841 Cataract extraction status, right eye: Secondary | ICD-10-CM | POA: Diagnosis not present

## 2024-03-01 DIAGNOSIS — Z961 Presence of intraocular lens: Secondary | ICD-10-CM | POA: Diagnosis not present

## 2024-03-15 DIAGNOSIS — G20A1 Parkinson's disease without dyskinesia, without mention of fluctuations: Secondary | ICD-10-CM | POA: Diagnosis not present

## 2024-03-15 DIAGNOSIS — F0392 Unspecified dementia, unspecified severity, with psychotic disturbance: Secondary | ICD-10-CM | POA: Diagnosis not present

## 2024-03-15 DIAGNOSIS — Z9849 Cataract extraction status, unspecified eye: Secondary | ICD-10-CM | POA: Diagnosis not present

## 2024-03-15 DIAGNOSIS — G5603 Carpal tunnel syndrome, bilateral upper limbs: Secondary | ICD-10-CM | POA: Diagnosis not present

## 2024-03-15 DIAGNOSIS — R7303 Prediabetes: Secondary | ICD-10-CM | POA: Diagnosis not present

## 2024-04-27 DIAGNOSIS — D2271 Melanocytic nevi of right lower limb, including hip: Secondary | ICD-10-CM | POA: Diagnosis not present

## 2024-04-27 DIAGNOSIS — D225 Melanocytic nevi of trunk: Secondary | ICD-10-CM | POA: Diagnosis not present

## 2024-04-27 DIAGNOSIS — L538 Other specified erythematous conditions: Secondary | ICD-10-CM | POA: Diagnosis not present

## 2024-04-27 DIAGNOSIS — L821 Other seborrheic keratosis: Secondary | ICD-10-CM | POA: Diagnosis not present

## 2024-04-27 DIAGNOSIS — D2272 Melanocytic nevi of left lower limb, including hip: Secondary | ICD-10-CM | POA: Diagnosis not present

## 2024-04-27 DIAGNOSIS — D2261 Melanocytic nevi of right upper limb, including shoulder: Secondary | ICD-10-CM | POA: Diagnosis not present

## 2024-04-27 DIAGNOSIS — L820 Inflamed seborrheic keratosis: Secondary | ICD-10-CM | POA: Diagnosis not present

## 2024-04-27 DIAGNOSIS — D2262 Melanocytic nevi of left upper limb, including shoulder: Secondary | ICD-10-CM | POA: Diagnosis not present

## 2024-05-09 DIAGNOSIS — S43401A Unspecified sprain of right shoulder joint, initial encounter: Secondary | ICD-10-CM | POA: Diagnosis not present

## 2024-05-25 DIAGNOSIS — R7303 Prediabetes: Secondary | ICD-10-CM | POA: Diagnosis not present

## 2024-05-25 DIAGNOSIS — Z87442 Personal history of urinary calculi: Secondary | ICD-10-CM | POA: Diagnosis not present

## 2024-05-25 DIAGNOSIS — N529 Male erectile dysfunction, unspecified: Secondary | ICD-10-CM | POA: Diagnosis not present

## 2024-05-25 DIAGNOSIS — G5603 Carpal tunnel syndrome, bilateral upper limbs: Secondary | ICD-10-CM | POA: Diagnosis not present

## 2024-05-25 DIAGNOSIS — G20A1 Parkinson's disease without dyskinesia, without mention of fluctuations: Secondary | ICD-10-CM | POA: Diagnosis not present

## 2024-06-16 DIAGNOSIS — L739 Follicular disorder, unspecified: Secondary | ICD-10-CM | POA: Diagnosis not present

## 2024-06-19 DIAGNOSIS — M75101 Unspecified rotator cuff tear or rupture of right shoulder, not specified as traumatic: Secondary | ICD-10-CM | POA: Diagnosis not present

## 2024-06-21 DIAGNOSIS — G5603 Carpal tunnel syndrome, bilateral upper limbs: Secondary | ICD-10-CM | POA: Diagnosis not present

## 2024-06-21 DIAGNOSIS — G20A1 Parkinson's disease without dyskinesia, without mention of fluctuations: Secondary | ICD-10-CM | POA: Diagnosis not present

## 2024-06-27 DIAGNOSIS — G5601 Carpal tunnel syndrome, right upper limb: Secondary | ICD-10-CM | POA: Diagnosis not present

## 2024-07-04 DIAGNOSIS — M75101 Unspecified rotator cuff tear or rupture of right shoulder, not specified as traumatic: Secondary | ICD-10-CM | POA: Diagnosis not present

## 2024-07-17 DIAGNOSIS — M75101 Unspecified rotator cuff tear or rupture of right shoulder, not specified as traumatic: Secondary | ICD-10-CM | POA: Diagnosis not present

## 2024-12-02 ENCOUNTER — Emergency Department
Admission: EM | Admit: 2024-12-02 | Discharge: 2024-12-02 | Disposition: A | Discharge status: Home / Self Care | Attending: Emergency Medicine | Admitting: Emergency Medicine

## 2024-12-02 ENCOUNTER — Other Ambulatory Visit: Payer: Self-pay

## 2024-12-02 ENCOUNTER — Emergency Department

## 2024-12-02 DIAGNOSIS — N132 Hydronephrosis with renal and ureteral calculous obstruction: Secondary | ICD-10-CM | POA: Insufficient documentation

## 2024-12-02 DIAGNOSIS — N200 Calculus of kidney: Secondary | ICD-10-CM

## 2024-12-02 DIAGNOSIS — R1031 Right lower quadrant pain: Secondary | ICD-10-CM | POA: Diagnosis present

## 2024-12-02 HISTORY — DX: Parkinson's disease without dyskinesia, without mention of fluctuations: G20.A1

## 2024-12-02 LAB — COMPREHENSIVE METABOLIC PANEL WITH GFR
ALT: 5 U/L (ref 0–44)
AST: 23 U/L (ref 15–41)
Albumin: 4.5 g/dL (ref 3.5–5.0)
Alkaline Phosphatase: 71 U/L (ref 38–126)
Anion gap: 11 (ref 5–15)
BUN: 15 mg/dL (ref 8–23)
CO2: 26 mmol/L (ref 22–32)
Calcium: 9.1 mg/dL (ref 8.9–10.3)
Chloride: 102 mmol/L (ref 98–111)
Creatinine, Ser: 1.08 mg/dL (ref 0.61–1.24)
GFR, Estimated: >60 mL/min
Glucose, Bld: 156 mg/dL — ABNORMAL HIGH (ref 70–99)
Potassium: 3.9 mmol/L (ref 3.5–5.1)
Sodium: 138 mmol/L (ref 135–145)
Total Bilirubin: 2 mg/dL — ABNORMAL HIGH (ref 0.0–1.2)
Total Protein: 6.7 g/dL (ref 6.5–8.1)

## 2024-12-02 LAB — CBC
HCT: 45.5 % (ref 39.0–52.0)
Hemoglobin: 16.3 g/dL (ref 13.0–17.0)
MCH: 32.9 pg (ref 26.0–34.0)
MCHC: 35.8 g/dL (ref 30.0–36.0)
MCV: 91.7 fL (ref 80.0–100.0)
Platelets: 168 K/uL (ref 150–400)
RBC: 4.96 MIL/uL (ref 4.22–5.81)
RDW: 12.8 % (ref 11.5–15.5)
WBC: 7 K/uL (ref 4.0–10.5)
nRBC: 0 % (ref 0.0–0.2)

## 2024-12-02 LAB — URINALYSIS, ROUTINE W REFLEX MICROSCOPIC
Bacteria, UA: NONE SEEN
Bilirubin Urine: NEGATIVE
Glucose, UA: NEGATIVE mg/dL
Ketones, ur: NEGATIVE mg/dL
Leukocytes,Ua: NEGATIVE
Nitrite: NEGATIVE
Protein, ur: NEGATIVE mg/dL
RBC / HPF: >50 RBC/hpf (ref 0–5)
Specific Gravity, Urine: 1.025 (ref 1.005–1.030)
pH: 5 (ref 5.0–8.0)

## 2024-12-02 LAB — LIPASE, BLOOD: Lipase: 26 U/L (ref 11–51)

## 2024-12-02 MED ORDER — NAPROXEN 500 MG PO TABS: 500.0000 mg | ORAL_TABLET | Freq: Two times a day (BID) | ORAL | 2 refills | Status: AC

## 2024-12-02 MED ORDER — ONDANSETRON 4 MG PO TBDP: 4.0000 mg | ORAL_TABLET | Freq: Three times a day (TID) | ORAL | 0 refills | Status: AC | PRN

## 2024-12-02 MED ORDER — HYDROCODONE-ACETAMINOPHEN 5-325 MG PO TABS: 1.0000 | ORAL_TABLET | Freq: Four times a day (QID) | ORAL | 0 refills | Status: AC | PRN

## 2024-12-02 MED ORDER — FENTANYL CITRATE (PF) 50 MCG/ML IJ SOSY
50.0000 ug | PREFILLED_SYRINGE | Freq: Once | INTRAMUSCULAR | Status: AC
  Administered 2024-12-02: 50 ug via INTRAVENOUS
  Filled 2024-12-02: qty 1

## 2024-12-02 MED ORDER — TAMSULOSIN HCL 0.4 MG PO CAPS: 0.4000 mg | ORAL_CAPSULE | Freq: Every day | ORAL | 0 refills | Status: AC

## 2024-12-02 NOTE — ED Provider Notes (Signed)
 "  [facility] Provider Note    Event Date/Time   First MD Initiated Contact with Patient 12/02/24 1346     (approximate)   History   Flank Pain   HPI  Robert Fuller is a 74 y.o. male with a history of kidney stones presents with complaints of right flank/right lower quadrant abdominal pain which started this morning with severe however has now improved.  He is reporting that it is difficult to urinate     Physical Exam   Triage Vital Signs: ED Triage Vitals  Encounter Vitals Group     BP 12/02/24 1327 (!) 154/101     Girls Systolic BP Percentile --      Girls Diastolic BP Percentile --      Boys Systolic BP Percentile --      Boys Diastolic BP Percentile --      Pulse Rate 12/02/24 1327 (!) 57     Resp 12/02/24 1332 20     Temp 12/02/24 1327 98 F (36.7 C)     Temp Source 12/02/24 1327 Oral     SpO2 12/02/24 1327 100 %     Weight 12/02/24 1330 99.8 kg (220 lb)     Height 12/02/24 1330 1.88 m (6' 2)     Head Circumference --      Peak Flow --      Pain Score 12/02/24 1329 10     Pain Loc --      Pain Education --      Exclude from Growth Chart --     Most recent vital signs: Vitals:   12/02/24 1332 12/02/24 1530  BP:  135/81  Pulse:  63  Resp: 20   Temp:    SpO2:  97%     General: Awake, no distress.  CV:  Good peripheral perfusion.  Resp:  Normal effort.  Abd:  No distention.  Other:     ED Results / Procedures / Treatments   Labs (all labs ordered are listed, but only abnormal results are displayed) Labs Reviewed  URINALYSIS, ROUTINE W REFLEX MICROSCOPIC - Abnormal; Notable for the following components:      Result Value   Color, Urine YELLOW (*)    APPearance HAZY (*)    Hgb urine dipstick SMALL (*)    All other components within normal limits  COMPREHENSIVE METABOLIC PANEL WITH GFR - Abnormal; Notable for the following components:   Glucose, Bld 156 (*)    Total Bilirubin 2.0 (*)    All other components within  normal limits  CBC  LIPASE, BLOOD     EKG     RADIOLOGY CT renal stone study    PROCEDURES:  Critical Care performed:   Procedures   MEDICATIONS ORDERED IN ED: Medications  fentaNYL  (SUBLIMAZE ) injection 50 mcg (50 mcg Intravenous Given 12/02/24 1500)     IMPRESSION / MDM / ASSESSMENT AND PLAN / ED COURSE  I reviewed the triage vital signs and the nursing notes. Patient's presentation is most consistent with acute presentation with potential threat to life or bodily function.  Patient presents with right sided pain as detailed above, differential includes ureterolithiasis, appendicitis, UTI  CT renal stone study pending, urinalysis pending.  Labs pending  CT scan consistent with 3 mm distal ureteral stone, urinalysis is unremarkable.  Patient is feeling much better after IV fentanyl   Appropriate for discharge at this time, he has a driver  Discussed hypodense area of the liver and the need  for outpatient follow-up, he will see his PCP next week      FINAL CLINICAL IMPRESSION(S) / ED DIAGNOSES   Final diagnoses:  Kidney stone     Rx / DC Orders   ED Discharge Orders          Ordered    HYDROcodone -acetaminophen  (NORCO/VICODIN) 5-325 MG tablet  Every 6 hours PRN        12/02/24 1532    naproxen  (NAPROSYN ) 500 MG tablet  2 times daily with meals        12/02/24 1532    ondansetron  (ZOFRAN -ODT) 4 MG disintegrating tablet  Every 8 hours PRN        12/02/24 1532    tamsulosin  (FLOMAX ) 0.4 MG CAPS capsule  Daily        12/02/24 1532             Note:  This document was prepared using Dragon voice recognition software and may include unintentional dictation errors.   Arlander Charleston, MD 12/02/24 1539  "

## 2024-12-02 NOTE — ED Triage Notes (Signed)
 Pt to ED for urinary retention unsure if he peed this AM or not because was having BM and also R flank pain since 0930. Pain is constant. Pt ambulatory. Bladder scan reveals 5mL.

## 2024-12-02 NOTE — Discharge Instructions (Addendum)
 Please follow-up with Dr. Bertrum to arrange for outpatient MRI to evaluate liver lesion
